# Patient Record
Sex: Female | Born: 1975 | Race: White | Hispanic: No | Marital: Married | Smoking: Never smoker
Health system: Southern US, Community
[De-identification: ages and names within clinical notes are randomized; demographics above are authoritative.]

## PROBLEM LIST (undated history)

## (undated) DIAGNOSIS — B009 Herpesviral infection, unspecified: Secondary | ICD-10-CM

## (undated) DIAGNOSIS — Z124 Encounter for screening for malignant neoplasm of cervix: Secondary | ICD-10-CM

## (undated) DIAGNOSIS — R000 Tachycardia, unspecified: Secondary | ICD-10-CM

## (undated) DIAGNOSIS — E559 Vitamin D deficiency, unspecified: Secondary | ICD-10-CM

## (undated) DIAGNOSIS — E162 Hypoglycemia, unspecified: Secondary | ICD-10-CM

## (undated) DIAGNOSIS — L309 Dermatitis, unspecified: Principal | ICD-10-CM

## (undated) DIAGNOSIS — N3010 Interstitial cystitis (chronic) without hematuria: Secondary | ICD-10-CM

## (undated) DIAGNOSIS — K219 Gastro-esophageal reflux disease without esophagitis: Secondary | ICD-10-CM

## (undated) DIAGNOSIS — R634 Abnormal weight loss: Secondary | ICD-10-CM

## (undated) DIAGNOSIS — M5020 Other cervical disc displacement, unspecified cervical region: Secondary | ICD-10-CM

## (undated) DIAGNOSIS — G4700 Insomnia, unspecified: Secondary | ICD-10-CM

## (undated) DIAGNOSIS — E538 Deficiency of other specified B group vitamins: Secondary | ICD-10-CM

## (undated) DIAGNOSIS — I7300 Raynaud's syndrome without gangrene: Secondary | ICD-10-CM

## (undated) DIAGNOSIS — B019 Varicella without complication: Secondary | ICD-10-CM

## (undated) DIAGNOSIS — Z9289 Personal history of other medical treatment: Secondary | ICD-10-CM

## (undated) HISTORY — DX: Interstitial cystitis (chronic) without hematuria: N30.10

## (undated) HISTORY — DX: Raynaud's syndrome without gangrene: I73.00

## (undated) HISTORY — DX: Vitamin D deficiency, unspecified: E55.9

## (undated) HISTORY — DX: Deficiency of other specified B group vitamins: E53.8

## (undated) HISTORY — DX: Dermatitis, unspecified: L30.9

## (undated) HISTORY — DX: Hypoglycemia, unspecified: E16.2

## (undated) HISTORY — DX: Herpesviral infection, unspecified: B00.9

## (undated) HISTORY — DX: Personal history of other medical treatment: Z92.89

## (undated) HISTORY — DX: Tachycardia, unspecified: R00.0

## (undated) HISTORY — DX: Varicella without complication: B01.9

## (undated) HISTORY — DX: Insomnia, unspecified: G47.00

## (undated) HISTORY — DX: Encounter for screening for malignant neoplasm of cervix: Z12.4

## (undated) HISTORY — DX: Abnormal weight loss: R63.4

---

## 1998-04-12 ENCOUNTER — Inpatient Hospital Stay (HOSPITAL_COMMUNITY): Admission: AD | Admit: 1998-04-12 | Discharge: 1998-04-12 | Payer: Self-pay | Admitting: Obstetrics and Gynecology

## 1998-07-02 ENCOUNTER — Ambulatory Visit (HOSPITAL_COMMUNITY): Admission: RE | Admit: 1998-07-02 | Discharge: 1998-07-02 | Payer: Self-pay | Admitting: Urology

## 1999-06-13 HISTORY — PX: OTHER SURGICAL HISTORY: SHX169

## 1999-12-13 ENCOUNTER — Other Ambulatory Visit: Admission: RE | Admit: 1999-12-13 | Discharge: 1999-12-13 | Payer: Self-pay | Admitting: Gynecology

## 2002-08-05 ENCOUNTER — Other Ambulatory Visit: Admission: RE | Admit: 2002-08-05 | Discharge: 2002-08-05 | Payer: Self-pay | Admitting: Gynecology

## 2003-08-11 ENCOUNTER — Other Ambulatory Visit: Admission: RE | Admit: 2003-08-11 | Discharge: 2003-08-11 | Payer: Self-pay | Admitting: Gynecology

## 2004-10-19 ENCOUNTER — Other Ambulatory Visit: Admission: RE | Admit: 2004-10-19 | Discharge: 2004-10-19 | Payer: Self-pay | Admitting: Gynecology

## 2004-11-03 ENCOUNTER — Other Ambulatory Visit: Admission: RE | Admit: 2004-11-03 | Discharge: 2004-11-03 | Payer: Self-pay | Admitting: Gynecology

## 2005-03-20 ENCOUNTER — Other Ambulatory Visit: Admission: RE | Admit: 2005-03-20 | Discharge: 2005-03-20 | Payer: Self-pay | Admitting: Gynecology

## 2005-05-12 ENCOUNTER — Ambulatory Visit (HOSPITAL_COMMUNITY): Admission: RE | Admit: 2005-05-12 | Discharge: 2005-05-12 | Payer: Self-pay | Admitting: Obstetrics and Gynecology

## 2005-05-12 ENCOUNTER — Encounter (INDEPENDENT_AMBULATORY_CARE_PROVIDER_SITE_OTHER): Payer: Self-pay | Admitting: Specialist

## 2005-08-30 ENCOUNTER — Other Ambulatory Visit: Admission: RE | Admit: 2005-08-30 | Discharge: 2005-08-30 | Payer: Self-pay | Admitting: Gynecology

## 2006-03-19 ENCOUNTER — Other Ambulatory Visit: Admission: RE | Admit: 2006-03-19 | Discharge: 2006-03-19 | Payer: Self-pay | Admitting: Gynecology

## 2007-02-14 ENCOUNTER — Other Ambulatory Visit: Admission: RE | Admit: 2007-02-14 | Discharge: 2007-02-14 | Payer: Self-pay | Admitting: Gynecology

## 2010-10-28 NOTE — Op Note (Signed)
Wendy Harrell, Wendy Harrell NO.:  [id number]   MEDICAL RECORD NO.:  [id number]          PATIENT TYPE:  AMB   LOCATION:  SDC                           FACILITY:  [facility]   PHYSICIAN:  Randye Lobo, M.D.   DATE OF BIRTH:  17-Jan-1976   DATE OF PROCEDURE:  05/12/2005  DATE OF DISCHARGE:                                 OPERATIVE REPORT   PREOPERATIVE DIAGNOSES:  1.  Intrauterine gestation at 11 +1 weeks.  2.  Missed abortion  3.  Rh negative status.   POSTOPERATIVE DIAGNOSES:  1.  Intrauterine gestation at 11 weeks.  2.  Missed abortion.  3.  Rh negative status.   PROCEDURE:  Dilation and evacuation.   SURGEON:  [name], M.D.   ANESTHESIA:  MAC, paracervical block with 1% lidocaine.   IV FLUIDS:  400 mL Ringer's lactate.   ESTIMATED BLOOD LOSS:  Minimal.   URINE OUTPUT:  425 mL.   COMPLICATIONS:  None.   INDICATIONS FOR PROCEDURE:  The patient is a 35 year old gravida 1, para 63,  Caucasian female with a last menstrual period February 24, 2005, who  presented to the office on May 11, 2005, reporting vaginal spotting.  The patient had previously been seen through a primary care office to  confirm her pregnancy at an early gestational age and was sent for  initiation of obstetric care.  The patient was seen approximately 10 days  ago, at which time she was noted to have a uterine size which was consistent  with 6 weeks.  The patient had an ultrasound performed when she presented on  May 11, 2005.  There was evidence of a 6+ weeks' gestation with a fetal  pole and no cardiac activity.  There was a small subchorionic hemorrhage and  the gestational sac was starting to have an irregular shape to it.  The  patient was diagnosed with a missed abortion.  A plan was made to proceed  with a dilation and evacuation after risks, benefits, and alternatives were  discussed with her.   The patient was noted to be Rh negative.   FINDINGS:  Examination under  anesthesia revealed a slightly retroverted  uterus, which was noted to be approximately 7-8 weeks' size.  No adnexal  masses were appreciated.   A moderate amount of products of conception were obtained.   SPECIMENS:  Products of conception were sent to pathology.   PROCEDURE:  The patient was identified in the preoperative hold area.  She  did receive Ancef 1 g intravenously for antibiotic prophylaxis.   In the operating room, MAC anesthesia was induced.  The patient's perineum  and vagina were then sterilely prepped and draped and her bladder was  catheterized of urine.   A speculum was placed inside the vagina and a single-tooth tenaculum was  placed on the anterior cervical lip.  The paracervical block was performed  in a standard fashion with a total of 10 mL of 1% lidocaine.  The uterus was  then sounded to 9 cm.  The cervix was then sequentially dilated up to a #25  Pratt dilator.  A #  8 suction-tip curette was then inserted through the  cervical os to the level of the uterine fundus.  It was withdrawn slightly.  Appropriate pressure was applied and the suction tip was withdrawn from the  uterine cavity while turning it in a clockwise fashion.  This was repeated  two additional times.  Sharp curettage was then gently performed in all four  quadrants of the uterus and the endometrium had a gritty texture to it.  The  suction-tip curette was introduced one final time into the uterine cavity to  remove any remaining blood clots.   This concluded the patient's procedure.  There were no complications.  All  needle, instrument, and sponge counts were correct.  The patient is escorted  to the recovery room in stable and awake condition.      Randye Lobo, M.D.  Electronically Signed     BES/MEDQ  D:  05/12/2005  T:  05/12/2005  Job:  [id number]

## 2011-02-24 ENCOUNTER — Encounter: Payer: Self-pay | Admitting: Family Medicine

## 2011-02-24 ENCOUNTER — Ambulatory Visit (INDEPENDENT_AMBULATORY_CARE_PROVIDER_SITE_OTHER): Payer: BC Managed Care – PPO | Admitting: Family Medicine

## 2011-02-24 VITALS — BP 126/81 | HR 79 | Temp 98.7°F | Ht 62.25 in | Wt 130.8 lb

## 2011-02-24 DIAGNOSIS — N3010 Interstitial cystitis (chronic) without hematuria: Secondary | ICD-10-CM

## 2011-02-24 DIAGNOSIS — Z0000 Encounter for general adult medical examination without abnormal findings: Secondary | ICD-10-CM

## 2011-02-24 DIAGNOSIS — L259 Unspecified contact dermatitis, unspecified cause: Secondary | ICD-10-CM

## 2011-02-24 DIAGNOSIS — L309 Dermatitis, unspecified: Secondary | ICD-10-CM

## 2011-02-24 DIAGNOSIS — B009 Herpesviral infection, unspecified: Secondary | ICD-10-CM

## 2011-02-24 MED ORDER — VALACYCLOVIR HCL 500 MG PO TABS: 500.0000 mg | ORAL_TABLET | Freq: Three times a day (TID) | ORAL | Status: AC

## 2011-02-24 MED ORDER — TRIAMCINOLONE ACETONIDE 0.1 % EX CREA: TOPICAL_CREAM | Freq: Two times a day (BID) | CUTANEOUS | Status: AC

## 2011-02-24 NOTE — Patient Instructions (Addendum)
Preventative Care for Adults - Female Studies show that half of deaths in the [place] today result from unhealthy lifestyle practices. This includes ignoring preventive care suggestions. Preventive health guidelines for women include the following key practices:  A routine yearly physical is a good way to check with your primary caregiver about your health and preventive screening. It is a chance to share any concerns and updates on your health, and to receive a thorough all-over exam.   If you smoke cigarettes, find out from your caregiver how to quit. It can literally save your life, no matter how long you have been a tobacco user. If you do not use tobacco, never start.   Maintain a healthy diet and normal weight. Increased weight leads to problems with blood pressure and diabetes. Decrease saturated fat in your diet and increase regular exercise. Eat a variety of foods, including fruit, vegetables, animal or vegetable protein (meat, fish, chicken, and eggs, or beans, lentils, and tofu), and grains, such as rice. Get information about proper diet from your caregiver, if needed.   Aerobic exercise helps maintain good heart health. The CDC and the American College of Sports Medicine recommend 30 minutes of moderate-intensity exercise (a brisk walk that increases your heart rate and breathing) on most days of the week. Ongoing high blood pressure should be treated with medicines, if weight loss and exercise are not effective.   Avoid smoking, drinking too much alcohol (more than two drinks per day), and use of street drugs. Do not share needles with anyone. Ask for professional help if you need support or instructions about stopping the use of alcohol, cigarettes, or drugs.   Maintain normal blood lipids and cholesterol, by minimizing your intake of saturated fat. Eat a well rounded diet, with plenty of fruit and vegetables. The National Institutes of Health encourage women to eat 5-9 servings of  fruit and vegetables each day. Your caregiver can give instructions to help you keep your risk of heart disease or stroke low. High blood pressure causes heart disease and increases risk of stroke. Blood pressure should be checked every 1-2 years, from age 20 onward.   Blood tests for high cholesterol, which causes heart and vessel disease, should begin at age 20 and be repeated every 5 years, if test results are normal. (Repeat tests more often if results are high.)   Diabetes screening involves taking a blood sample to check your blood sugar level, after a fasting period. This is done once every 3 years, after age 45, if test results are normal.   Breast cancer screening is essential to preventive care for women. All women age 20 and older should perform a breast self-exam every month. At age 40 and older, women should have their caregiver complete a breast exam each year. Women at ages 40-50 should have a mammogram (x-ray film) of the breasts each year. Your caregiver can discuss when to start your yearly mammograms.   Cervical cancer screening includes taking a Pap smear (sample of cells examined under a microscope) from the cervix (end of the uterus). It also includes testing for HPV (Human Papilloma Virus, which can cause cervical cancer). Screening and a pelvic exam should begin at age 21, or 3 years after a woman becomes sexually active. Screening should occur every year, with a Pap smear but no HPV testing, up to age 30. After age 30, you should have a Pap smear every 3 years with HPV testing, if no HPV was found previously.     Colon cancer can be detected, and often prevented, long before it is life threatening. Most routine colon cancer screening begins at the age of 50. On a yearly basis, your caregiver may provide easy-to-use take-home tests to check for hidden blood in the stool. Use of a small camera at the end of a tube, to directly examine the colon (sigmoidoscopy or colonoscopy), can  detect the earliest forms of colon cancer and can be life saving. Talk to your caregiver about this at age 50, when routine screening begins. (Screening is repeated every 5 years, unless early forms of pre-cancerous polyps or small growths are found.)   Practice safe sex. Use condoms. Condoms are used for birth control and to reduce the spread of sexually transmitted infections (STIs). Unsafe sex is sexual activity without the use of safeguards, such as condoms and avoidance of high-risk acts, to reduce the chances of getting or spreading STIs. STIs include gonorrhea (the clap), chlamydia, syphilis, trichimonas, herpes, HPV (human papilloma virus) and HIV (human immunodeficiency virus), which causes AIDS. Herpes, HIV, and HPV are viral illnesses that have no cure. They can result in disability, cancer, and death.   HPV causes cancer of the cervix, and other infections that can be transmitted from person to person. There is a vaccine for HPV, and females should get immunized between the ages of 11 and 26. It requires a series of 3 shots.   Osteoporosis is a disease in which the bones lose minerals and strength as we age. This can result in serious bone fractures. Risk of osteoporosis can be identified using a bone density scan. Women ages 65 and over should discuss this with their caregivers, as should women after menopause who have other risk factors. Ask your caregiver whether you should be taking a calcium supplement and Vitamin D, to reduce the rate of osteoporosis.   Menopause can be associated with physical symptoms and risks. Hormone replacement therapy is available to decrease these. You should talk to your caregiver about whether starting or continuing to take hormones is right for you.   Use sunscreen with SPF (skin protection factor) of 15 or more. Apply sunscreen liberally and repeatedly throughout the day. Being outside in the sun, when your shadow is shorter than you are, means you are being  exposed to sun at greater intensity. Lighter skinned people are at a greater risk of skin cancer. Wear sunglasses, to protect your eyes from too much damaging sunlight (which can speed up cataract formation).   Once a month, do a whole body skin exam or review, using a mirror to look at your back. Notify your caregiver of changes in moles, especially if there are changes in shapes, colors, irregular border, a size larger than a pencil eraser, or new moles develop.   Keep carbon monoxide and smoke detectors in your home, and functioning, at all times. Change the batteries every 6 months, or use a model that plugs into the wall.   Stay up to date with your tetanus shots and other required immunizations. A booster for tetanus should be given every 10 years. Be sure to get your flu shot every year, since 5%-20% of the [place] population comes down with the flu. The composition of the flu vaccine changes each year, so being vaccinated once is not enough. Get your shot in the fall, before the flu season peaks. The table below lists important vaccines to get. Other vaccines to consider include for Hepatitis A virus (to prevent a form of   infection of the liver, by a virus acquired from food), Varicella Zoster (a virus that causes shingles), and Meninogoccal (against bacteria which cause a form of meningitis).   Brush your teeth twice a day with fluoride toothpaste, and floss once a day. Good oral hygiene prevents tooth decay and gum disease, which can be painful and can cause other health problems. Visit your dentist for a routine oral and dental check up and preventive care every 6-12 months.   The Body Mass Index or BMI is a way of measuring how much of your body is fat. Having a BMI above 27 increases the risk of heart disease, diabetes, hypertension, stroke and other problems related to obesity. Your caregiver can help determine your BMI, and can develop an exercise and dietary program to help you achieve or  maintain this measurement at a healthy level.   Wear seat belts whenever you are in a vehicle, whether as passenger or driver, and even for short drives of a few minutes.   If you bicycle, wear a helmet at all times.  Preventative Care for Adult Women  Preventative Services Ages 4-39 Ages 8-64 Ages 49 and over  Health risk assessment and lifestyle counseling.     Blood pressure check.** Every 1-2 years Every 1-2 years Every 1-2 years  Total cholesterol check including HDL.** Every 5 years beginning at age 61 Every 5 years beginning at age 42, or more often if risk is high Every 5 years through age 58, then optional  Breast self exam. Monthly in all women ages 91 and older Monthly Monthly  Clinical breast exam.** Every 3 years beginning at age 82 Every year Every year  Mammogram.**  Every year beginning at age 83, optional from age 20-49 (discuss with your caregiver). Every year until age 89, then optional  Pap Smear** and HPV Screening. Every year from ages 68 through 61 Every 3 years from ages 59 through 83, if HPV is negative Optional; talk with your caregiver  Flexible sigmoidoscopy** or colonoscopy.**   Every 5 years beginning at age 61 Every 5 years until age 38; then optional  FOBT (fecal occult blood test) of stool.  Every year beginning at age 17 Every year until 10; then optional  Skin self-exam. Monthly Monthly Monthly  Tetanus-diphtheria (Td) immunization. Every 10 years Every 10 years Every 10 years  Influenza immunization.** Every year Every year Every year  HPV immunization. Once between the ages of 12 and 61     Pneumococcal immunization.** Optional Optional Every 5 years  Hepatitis B immunization.** Series of 3 immunizations  (if not done previously, usually given at 0, 1 to 2, and 4 to 6 months)  Check with your caregiver, if vaccination not previously given Check with your caregiver, if vaccination not previously given  ** Family history and personal history of risk and  conditions may change your caregiver's recommendations.  Document Released: 07/25/2001 Document Re-Released: 08/23/2009 [facility] Patient Information 2011 [place], [place].   Start MegaRed caps 1 daily by Schiff  Take your probiotic every day

## 2011-03-01 ENCOUNTER — Encounter: Payer: Self-pay | Admitting: Family Medicine

## 2011-03-01 DIAGNOSIS — B009 Herpesviral infection, unspecified: Secondary | ICD-10-CM

## 2011-03-01 DIAGNOSIS — N3010 Interstitial cystitis (chronic) without hematuria: Secondary | ICD-10-CM | POA: Insufficient documentation

## 2011-03-01 DIAGNOSIS — Z0000 Encounter for general adult medical examination without abnormal findings: Secondary | ICD-10-CM | POA: Insufficient documentation

## 2011-03-01 DIAGNOSIS — L309 Dermatitis, unspecified: Secondary | ICD-10-CM | POA: Insufficient documentation

## 2011-03-01 HISTORY — DX: Herpesviral infection, unspecified: B00.9

## 2011-03-01 HISTORY — DX: Interstitial cystitis (chronic) without hematuria: N30.10

## 2011-03-01 HISTORY — DX: Dermatitis, unspecified: L30.9

## 2011-03-01 NOTE — Assessment & Plan Note (Signed)
Was recently started on Acyclovir 800mg  tid for an outbreak on her chest. 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

## 2011-03-01 NOTE — Assessment & Plan Note (Signed)
Patient previously took Amitriptyline when she had significant trouble in 2001, more recently she has had very little trouble and does not take anything. As it flares from time to time she is encouraged to increase hydration and avoid offending foods.

## 2011-03-01 NOTE — Assessment & Plan Note (Addendum)
Encouraged to get 7-8 hours of sleep nightly, exercise regularly eat small, frequent meals with lean proteins and complex carbs. Use seat belts regularly and start MegaRed fish oil caps daily. For her gassy symptoms she is encouraged to avoid offending foods consider adding a fiber supplement and a probiotic and report if symptoms worsen

## 2011-03-01 NOTE — Progress Notes (Signed)
Wendy Harrell [id number] 23-Jul-1975 03/01/2011      Progress Note New Patient  Subjective  Chief Complaint  Chief Complaint  Patient presents with  . Establish Care    new patient    HPI  Patient is a 35 year old Caucasian female who is in today for new patient appointment. 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 She describes them as clustered on the on the left side of her chest erythematous and there is a burning pain to them. She was started on acyclovir earlier in the week for that. Then over the last couple of issues also developed an urticarial raised very itchy rash in the left arm which she says is different. She was out at a race several days ago and the rash appeared and that she believes she got into some poison oak. She has not tried anything topically for this so far. Her other complaints are some mild gaseousness and cramping off and on over several weeks this is slowly improving. No fevers, chills, bloody stool, constipation, diarrhea. She reports some low-grade issues with depression and cholesterol when she was younger but this has not been persistent. She denies any other acute complaints at this time. Did have a history of interstitial cystitis but that has also improved. On occasion she gets some mild irritation and burning but no longer requires medications.  Past Medical History  Diagnosis Date  . Chicken pox as a child  . 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 Interstitial cystitis 03/01/2011  . Dermatitis 03/01/2011    Past Surgical History  Procedure Date  . Laproscopy 2001  . Cystoscopy 2001    benign    Family History  Problem Relation Age of Onset  . Depression Father   . Heart disease Father   . Hypertension Father   . Hyperlipidemia Father   . Diabetes Father     type 2  . Arthritis Father   . Anxiety disorder Father   . Alcohol abuse Maternal Grandfather   . Depression Paternal Grandmother   . Alcohol abuse Paternal Grandmother     . Alcohol abuse Paternal Grandfather   . Heart attack Paternal Grandfather   . Cancer Maternal Grandmother     skin cancer, breast cancer    History   Social History  . Marital Status: Married    Spouse Name: N/A    Number of Children: N/A  . Years of Education: N/A   Occupational History  . Not on file.   Social History Main Topics  . Smoking status: Never Smoker   . Smokeless tobacco: Never Used  . Alcohol Use: No  . Drug Use: No  . Sexually Active: Yes -- Female partner(s)   Other Topics Concern  . Not on file   Social History Narrative  . No narrative on file    No current outpatient prescriptions on file prior to visit.    Allergies  Allergen Reactions  . Sulfa Antibiotics Rash    Blisters in the mouth    Review of Systems  Review of Systems  Constitutional: Negative for fever, chills and malaise/fatigue.  HENT: Negative for hearing loss, nosebleeds and congestion.   Eyes: Negative for discharge.  Respiratory: Negative for cough, sputum production, shortness of breath and wheezing.   Cardiovascular: Negative for chest pain, palpitations and leg swelling.  Gastrointestinal: Positive for abdominal pain. Negative for heartburn, nausea, vomiting, diarrhea, constipation and blood in stool.  Genitourinary: Negative for dysuria, urgency, frequency and hematuria.  Musculoskeletal: Negative for myalgias, back pain and falls.  Skin: Positive for rash.       Left anterior chest wall, red and vesicular, left arm with hives, raised and red, itchy  Neurological: Negative for dizziness, tremors, sensory change, focal weakness, loss of consciousness, weakness and headaches.  Endo/Heme/Allergies: Negative for polydipsia. Does not bruise/bleed easily.  Psychiatric/Behavioral: Negative for depression and suicidal ideas. The patient is not nervous/anxious and does not have insomnia.     Objective  BP 126/81  Pulse 79  Temp(Src) 98.7 F (37.1 C) (Oral)  Ht 5' 2.25"  (1.581 m)  Wt 130 lb 12.8 oz (59.33 kg)  BMI 23.73 kg/m2  SpO2 99%  LMP 02/11/2011  Physical Exam  Physical Exam  Constitutional: She is oriented to person, place, and time and well-developed, well-nourished, and in no distress. No distress.  HENT:  Head: Normocephalic and atraumatic.  Right Ear: External ear normal.  Left Ear: External ear normal.  Nose: Nose normal.  Mouth/Throat: Oropharynx is clear and moist. No oropharyngeal exudate.  Eyes: Conjunctivae are normal. Pupils are equal, round, and reactive to light. Right eye exhibits no discharge. Left eye exhibits no discharge. No scleral icterus.  Neck: Normal range of motion. Neck supple. No thyromegaly present.  Cardiovascular: Normal rate, regular rhythm, normal heart sounds and intact distal pulses.   No murmur heard. Pulmonary/Chest: Effort normal and breath sounds normal. No respiratory distress. She has no wheezes. She has no rales.  Abdominal: Soft. Bowel sounds are normal. She exhibits no distension and no mass. There is no tenderness.  Musculoskeletal: Normal range of motion. She exhibits no edema and no tenderness.  Lymphadenopathy:    She has no cervical adenopathy.  Neurological: She is alert and oriented to person, place, and time. She has normal reflexes. No cranial nerve deficit. Coordination normal.  Skin: Skin is warm and dry. Rash noted. She is not diaphoretic. There is erythema.       Left arm, irregulary shaped oblong lesion centered around elbow, roughly 2 in by 5 in, raised and erythematous most notably at edge of lesion. On left anterior CW, small patch of vesicles on erythematous base  Psychiatric: Mood, memory and affect normal.       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  Dermatitis Lesion on left arm is urticarial and she did develop this after bing outside on Sunday. It is pruritic, Is asked to clean it well with mild soap and water, cut fingernails, wipe down with [facility] Astringent and the apply Triamcinolone Cream as prescribed.  Interstitial cystitis Patient previously took Amitriptyline when she had significant trouble in 2001, more recently she has had very little trouble and does not take anything. As it flares from time to time she is encouraged to increase hydration and avoid offending foods.   Preventative health care Encouraged to get 7-8 hours of sleep nightly, exercise regularly eat small, frequent meals with lean proteins and complex carbs. Use seat belts regularly and start MegaRed fish oil caps daily. For her gassy symptoms she is encouraged to avoid offending foods consider adding a fiber supplement and a probiotic and report if symptoms worsen

## 2011-03-01 NOTE — Assessment & Plan Note (Signed)
Lesion on left arm is urticarial and she did develop this after bing outside on Sunday. It is pruritic, Is asked to clean it well with mild soap and water, cut fingernails, wipe down with [facility] Astringent and the apply Triamcinolone Cream as prescribed.

## 2011-07-18 ENCOUNTER — Encounter: Payer: Self-pay | Admitting: Family Medicine

## 2011-07-18 ENCOUNTER — Ambulatory Visit (INDEPENDENT_AMBULATORY_CARE_PROVIDER_SITE_OTHER): Payer: BC Managed Care – PPO | Admitting: Family Medicine

## 2011-07-18 VITALS — BP 146/85 | HR 129 | Temp 100.5°F | Ht 62.25 in | Wt 128.8 lb

## 2011-07-18 DIAGNOSIS — J329 Chronic sinusitis, unspecified: Secondary | ICD-10-CM

## 2011-07-18 MED ORDER — GUAIFENESIN ER 600 MG PO TB12: 600.0000 mg | ORAL_TABLET | Freq: Two times a day (BID) | ORAL | Status: AC

## 2011-07-18 MED ORDER — HYDROCOD POLST-CHLORPHEN POLST 10-8 MG/5ML PO LQCR: 5.0000 mL | Freq: Two times a day (BID) | ORAL | Status: DC | PRN

## 2011-07-18 MED ORDER — CEFDINIR 300 MG PO CAPS: 300.0000 mg | ORAL_CAPSULE | Freq: Two times a day (BID) | ORAL | Status: AC

## 2011-07-18 NOTE — Assessment & Plan Note (Signed)
Patient sick with persistent fevers and congestion and now worsening facial pain. Will rx Cefdinir 300 mg po bid and Mucinex bid, increase rest and fluids, given Tussionex to use qhs for cough.

## 2011-07-18 NOTE — Progress Notes (Signed)
Patient ID: Wendy Harrell, female   DOB: 1975-10-11, 36 y.o.   MRN: [id number] Wendy Harrell [id number] 10/03/1975 07/18/2011      Progress Note-Follow Up  Subjective  Chief Complaint  Chief Complaint  Patient presents with  . Sinusitis    sinus infection X 10 days  . Cough    X 10 days w/ phlegm (white)  . Nausea    today    HPI  Patient is a 36 yo female in today with a 10 day history of persistent fevers, congestion, cough. She's been having fevers over 100 for days now. She is worsening facial pain or pressure over both cheeks. She has congestion now productive of yellow to clear sputum. His postnasal drip cough that keeps her up at night. The cough has just become productive. In the last 24 hours she's also noted some nausea and she has been struggling with diarrhea for days now. She has generalized malaise myalgias and fatigue as well. Past Medical History  Diagnosis Date  . Chicken pox as a child  . 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 Interstitial cystitis 03/01/2011  . Dermatitis 03/01/2011    Past Surgical History  Procedure Date  . Laproscopy 2001  . Cystoscopy 2001    benign    Family History  Problem Relation Age of Onset  . Depression Father   . Heart disease Father   . Hypertension Father   . Hyperlipidemia Father   . Diabetes Father     type 2  . Arthritis Father   . Anxiety disorder Father   . Alcohol abuse Maternal Grandfather   . Depression Paternal Grandmother   . Alcohol abuse Paternal Grandmother   . Alcohol abuse Paternal Grandfather   . Heart attack Paternal Grandfather   . Cancer Maternal Grandmother     skin cancer, breast cancer    History   Social History  . Marital Status: Married    Spouse Name: N/A    Number of Children: N/A  . Years of Education: N/A   Occupational History  . Not on file.   Social History Main Topics  . Smoking status: Never Smoker   . Smokeless tobacco: Never Used  . Alcohol Use: No  . Drug Use: No  .  Sexually Active: Yes -- Female partner(s)   Other Topics Concern  . Not on file   Social History Narrative  . No narrative on file    Current Outpatient Prescriptions on File Prior to Visit  Medication Sig Dispense Refill  . Multiple Vitamin (MULTIVITAMIN) tablet Take 1 tablet by mouth daily.        Marland Kitchen triamcinolone (KENALOG) 0.1 % cream Apply topically 2 (two) times daily.  45 g  0    Allergies  Allergen Reactions  . Sulfa Antibiotics Rash    Blisters in the mouth    Review of Systems  Review of Systems  Constitutional: Positive for fever and malaise/fatigue.  HENT: Positive for ear pain, congestion and sore throat.   Eyes: Negative for discharge.  Respiratory: Positive for cough. Negative for shortness of breath and wheezing.   Cardiovascular: Negative for chest pain, palpitations and leg swelling.  Gastrointestinal: Positive for nausea and diarrhea. Negative for heartburn and abdominal pain.  Genitourinary: Negative for dysuria.  Musculoskeletal: Positive for myalgias. Negative for falls.  Skin: Negative for rash.  Neurological: Positive for headaches. Negative for loss of consciousness.  Endo/Heme/Allergies: Negative for polydipsia.  Psychiatric/Behavioral: Negative for depression and suicidal ideas. The patient is not  nervous/anxious and does not have insomnia.     Objective  BP 146/85  Pulse 129  Temp(Src) 100.5 F (38.1 C) (Temporal)  Ht 5' 2.25" (1.581 m)  Wt 128 lb 12.8 oz (58.423 kg)  BMI 23.37 kg/m2  SpO2 100%  LMP 07/17/2011  Physical Exam  Physical Exam  Constitutional: She is oriented to person, place, and time and well-developed, well-nourished, and in no distress. No distress.  HENT:  Head: Normocephalic and atraumatic.       B/l ear canals with a large amount of wax in b/l canalsm able to see past b/l at top, tm's dull but not erythematous. Nasal mucosa boggy and erythematous  Eyes: Conjunctivae are normal.  Neck: Neck supple. No thyromegaly  present.  Cardiovascular: Normal rate, regular rhythm and normal heart sounds.   No murmur heard. Pulmonary/Chest: Effort normal and breath sounds normal. She has no wheezes.  Abdominal: She exhibits no distension and no mass.  Musculoskeletal: She exhibits no edema.  Lymphadenopathy:    She has no cervical adenopathy.  Neurological: She is alert and oriented to person, place, and time.  Skin: Skin is warm and dry. No rash noted. She is not diaphoretic.  Psychiatric: Memory, affect and judgment normal.       Assessment & Plan  Sinusitis Patient sick with persistent fevers and congestion and now worsening facial pain. Will rx Cefdinir 300 mg po bid and Mucinex bid, increase rest and fluids, given Tussionex to use qhs for cough.

## 2011-07-18 NOTE — Patient Instructions (Signed)
Sinusitis Sinuses are air pockets within the bones of your face. The growth of bacteria within a sinus leads to infection. The infection prevents the sinuses from draining. This infection is called sinusitis. SYMPTOMS  There will be different areas of pain depending on which sinuses have become infected.  The maxillary sinuses often produce pain beneath the eyes.   Frontal sinusitis may cause pain in the middle of the forehead and above the eyes.  Other problems (symptoms) include:  Toothaches.   Colored, pus-like (purulent) drainage from the nose.   Swelling, warmth, and tenderness over the sinus areas may be signs of infection.  TREATMENT  Sinusitis is most often determined by an exam.X-rays may be taken. If x-rays have been taken, make sure you obtain your results or find out how you are to obtain them. Your caregiver may give you medications (antibiotics). These are medications that will help kill the bacteria causing the infection. You may also be given a medication (decongestant) that helps to reduce sinus swelling.  HOME CARE INSTRUCTIONS   Only take over-the-counter or prescription medicines for pain, discomfort, or fever as directed by your caregiver.   Drink extra fluids. Fluids help thin the mucus so your sinuses can drain more easily.   Applying either moist heat or ice packs to the sinus areas may help relieve discomfort.   Use saline nasal sprays to help moisten your sinuses. The sprays can be found at your local drugstore.  SEEK IMMEDIATE MEDICAL CARE IF:  You have a fever.   You have increasing pain, severe headaches, or toothache.   You have nausea, vomiting, or drowsiness.   You develop unusual swelling around the face or trouble seeing.  MAKE SURE YOU:   Understand these instructions.   Will watch your condition.   Will get help right away if you are not doing well or get worse.  Document Released: 05/29/2005 Document Revised: 02/08/2011 Document Reviewed:  12/26/2006 [facility] Patient Information 2012 [place], [place].   Afrin at bed Nasal saline in am For ears, flush with warm water and hydrogen peroxide and call for further flushing as needed

## 2011-07-27 ENCOUNTER — Ambulatory Visit: Payer: BC Managed Care – PPO | Admitting: Family Medicine

## 2011-07-28 ENCOUNTER — Telehealth: Payer: Self-pay | Admitting: Emergency Medicine

## 2011-07-28 MED ORDER — FLUCONAZOLE 150 MG PO TABS: 150.0000 mg | ORAL_TABLET | Freq: Once | ORAL | Status: DC

## 2011-07-28 NOTE — Telephone Encounter (Signed)
Ok.  Noted--PM

## 2011-07-28 NOTE — Telephone Encounter (Signed)
Pt received Omnicef and now has yeast infection.  Would like RX for diflucan.  Per Dr. Milinda Cave OK.  Diflucan 150 mg, #1 x 0.  RX sent.

## 2012-08-05 ENCOUNTER — Other Ambulatory Visit: Payer: Self-pay | Admitting: Gynecology

## 2012-11-01 ENCOUNTER — Ambulatory Visit (INDEPENDENT_AMBULATORY_CARE_PROVIDER_SITE_OTHER): Payer: BC Managed Care – PPO | Admitting: Nurse Practitioner

## 2012-11-01 ENCOUNTER — Encounter: Payer: Self-pay | Admitting: Nurse Practitioner

## 2012-11-01 VITALS — BP 100/60 | HR 93 | Temp 98.2°F | Ht 62.25 in | Wt 133.8 lb

## 2012-11-01 DIAGNOSIS — B009 Herpesviral infection, unspecified: Secondary | ICD-10-CM | POA: Insufficient documentation

## 2012-11-01 NOTE — Patient Instructions (Addendum)
Your rash looks like HSV. Continue with valacyclovir 1 day dosing : 2 gm breakfast & bedtime for 1 day then discontinue. You may also use lysine 500 mg twice daily until rash begins to clear. Use benadryl cream for itching. Keep rash clean w/mild soapy wash twice dail, apply thin layer of bacitracin to leg rash to prevent secondary infection. Let us know if you need a new script. Feel better!

## 2012-11-01 NOTE — Progress Notes (Signed)
  Subjective:    Patient ID: Wendy Harrell, female    DOB: [birth year], 37 y.o.   MRN: [id number]  HPI    Review of Systems     Objective:   Physical Exam        Assessment & Plan:   Subjective:     Wendy Harrell is a 37 y.o. female who presents for evaluation of a rash involving the L groin, L arm L abdomen. Rash started 2 weeks ago, but continues to get new lesions. . ago. Lesions are pink, and raised, clusters of tiny vesicles approx 1mm in texture. Rash has changed over time. Rash is painful and is pruritic. Associated symptoms: none. Patient denies: arthralgia, fever and headache. Patient has not had contacts with similar rash. Patient has not had new exposures (soaps, lotions, laundry detergents, foods, medications, plants, insects or animals).  The following portions of the patient's history were reviewed and updated as appropriate: allergies, current medications, past family history, past medical history, past social history, past surgical history and problem list.  Review of Systems Constitutional: negative Integument/breast: negative, recurrent rash of HSV, previously on chest 9 mos ago. Now has new locations-see HPI Allergic/Immunologic: negative    Objective:    BP 100/60  Pulse 93  Temp(Src) 98.2 F (36.8 C) (Oral)  Ht 5' 2.25" (1.581 m)  Wt 133 lb 12 oz (60.669 kg)  BMI 24.27 kg/m2  SpO2 97%  LMP 10/18/2012 General:  alert, cooperative, appears stated age and no distress  Skin:  vesicles noted on L arm-few small clusters of vesicles, L abdomen linear cluster vesicles, L groin large cluster vesilces approx 3 cm discrete irreg shape.     Assessment:    HSV recurrence    Plan:    Valacyclovir 2000 mg q12h times 1 day. Pt has script. will call if needed. See pt instructions.

## 2013-06-03 ENCOUNTER — Ambulatory Visit (INDEPENDENT_AMBULATORY_CARE_PROVIDER_SITE_OTHER): Payer: BC Managed Care – PPO | Admitting: Nurse Practitioner

## 2013-06-03 ENCOUNTER — Ambulatory Visit: Payer: BC Managed Care – PPO | Admitting: Nurse Practitioner

## 2013-06-03 ENCOUNTER — Encounter: Payer: Self-pay | Admitting: Nurse Practitioner

## 2013-06-03 ENCOUNTER — Telehealth: Payer: Self-pay | Admitting: Nurse Practitioner

## 2013-06-03 VITALS — BP 100/60 | HR 119 | Ht 62.25 in | Wt 132.8 lb

## 2013-06-03 DIAGNOSIS — S39012A Strain of muscle, fascia and tendon of lower back, initial encounter: Secondary | ICD-10-CM

## 2013-06-03 DIAGNOSIS — IMO0002 Reserved for concepts with insufficient information to code with codable children: Secondary | ICD-10-CM

## 2013-06-03 MED ORDER — METHOCARBAMOL 500 MG PO TABS: 500.0000 mg | ORAL_TABLET | Freq: Four times a day (QID) | ORAL | Status: DC | PRN

## 2013-06-03 MED ORDER — PREDNISONE 10 MG PO TABS: ORAL_TABLET | ORAL | Status: DC

## 2013-06-03 MED ORDER — METHYLPREDNISOLONE ACETATE 40 MG/ML IJ SUSP
40.0000 mg | Freq: Once | INTRAMUSCULAR | Status: AC
  Administered 2013-06-03: 40 mg via INTRAMUSCULAR

## 2013-06-03 NOTE — Patient Instructions (Signed)
Take prednisone early in day so it does not keep you awake. Stop if you feel depressed or have suicidal ideation. Use muscle relaxer when you cal hang out on couch or gog to bed. Walk every day. Do stretches daily. Hold positions 10-15 sec. Do 10 reps, repeat twice. Let us know if you do not feel better.  Lumbosacral Strain Lumbosacral strain is one of the most common causes of back pain. There are many causes of back pain. Most are not serious conditions. CAUSES  Your backbone (spinal column) is made up of 24 main vertebral bodies, the sacrum, and the coccyx. These are held together by muscles and tough, fibrous tissue (ligaments). Nerve roots pass through the openings between the vertebrae. A sudden move or injury to the back may cause injury to, or pressure on, these nerves. This may result in localized back pain or pain movement (radiation) into the buttocks, down the leg, and into the foot. Sharp, shooting pain from the buttock down the back of the leg (sciatica) is frequently associated with a ruptured (herniated) disk. Pain may be caused by muscle spasm alone. Your caregiver can often find the cause of your pain by the details of your symptoms and an exam. In some cases, you may need tests (such as X-rays). Your caregiver will work with you to decide if any tests are needed based on your specific exam. HOME CARE INSTRUCTIONS   Avoid an underactive lifestyle. Active exercise, as directed by your caregiver, is your greatest weapon against back pain.  Avoid hard physical activities (tennis, racquetball, waterskiing) if you are not in proper physical condition for it. This may aggravate or create problems.  If you have a back problem, avoid sports requiring sudden body movements. Swimming and walking are generally safer activities.  Maintain good posture.  Avoid becoming overweight (obese).  Use bed rest for only the most extreme, sudden (acute) episode. Your caregiver will help you determine  how much bed rest is necessary.  For acute conditions, you may put ice on the injured area.  Put ice in a plastic bag.  Place a towel between your skin and the bag.  Leave the ice on for 15-20 minutes at a time, every 2 hours, or as needed.  After you are improved and more active, it may help to apply heat for 30 minutes before activities. See your caregiver if you are having pain that lasts longer than expected. Your caregiver can advise appropriate exercises or therapy if needed. With conditioning, most back problems can be avoided. SEEK IMMEDIATE MEDICAL CARE IF:   You have numbness, tingling, weakness, or problems with the use of your arms or legs.  You experience severe back pain not relieved with medicines.  There is a change in bowel or bladder control.  You have increasing pain in any area of the body, including your belly (abdomen).  You notice shortness of breath, dizziness, or feel faint.  You feel sick to your stomach (nauseous), are throwing up (vomiting), or become sweaty.  You notice discoloration of your toes or legs, or your feet get very cold.  Your back pain is getting worse.  You have a fever. MAKE SURE YOU:   Understand these instructions.  Will watch your condition.  Will get help right away if you are not doing well or get worse. Document Released: 03/08/2005 Document Revised: 08/21/2011 Document Reviewed: 08/28/2008 [facility] Patient Information 2014 [place], [place].  Low Back Strain with Rehab A strain is an injury in  which a tendon or muscle is torn. The muscles and tendons of the lower back are vulnerable to strains. However, these muscles and tendons are very strong and require a great force to be injured. Strains are classified into three categories. Grade 1 strains cause pain, but the tendon is not lengthened. Grade 2 strains include a lengthened ligament, due to the ligament being stretched or partially ruptured. With grade 2 strains there is  still function, although the function may be decreased. Grade 3 strains involve a complete tear of the tendon or muscle, and function is usually impaired. SYMPTOMS   Pain in the lower back.  Pain that affects one side more than the other.  Pain that gets worse with movement and may be felt in the hip, buttocks, or back of the thigh.  Muscle spasms of the muscles in the back.  Swelling along the muscles of the back.  Loss of strength of the back muscles.  Crackling sound (crepitation) when the muscles are touched. CAUSES  Lower back strains occur when a force is placed on the muscles or tendons that is greater than they can handle. Common causes of injury include:  Prolonged overuse of the muscle-tendon units in the lower back, usually from incorrect posture.  A single violent injury or force applied to the back. RISK INCREASES WITH:  Sports that involve twisting forces on the spine or a lot of bending at the waist (football, rugby, weightlifting, bowling, golf, tennis, speed skating, racquetball, swimming, running, gymnastics, diving).  Poor strength and flexibility.  Failure to warm up properly before activity.  Family history of lower back pain or disk disorders.  Previous back injury or surgery (especially fusion).  Poor posture with lifting, especially heavy objects.  Prolonged sitting, especially with poor posture. PREVENTION   Learn and use proper posture when sitting or lifting (maintain proper posture when sitting, lift using the knees and legs, not at the waist).  Warm up and stretch properly before activity.  Allow for adequate recovery between workouts.  Maintain physical fitness:  Strength, flexibility, and endurance.  Cardiovascular fitness. PROGNOSIS  If treated properly, lower back strains usually heal within 6 weeks. RELATED COMPLICATIONS   Recurring symptoms, resulting in a chronic problem.  Chronic inflammation, scarring, and partial  muscle-tendon tear.  Delayed healing or resolution of symptoms.  Prolonged disability. TREATMENT  Treatment first involves the use of ice and medicine, to reduce pain and inflammation. The use of strengthening and stretching exercises may help reduce pain with activity. These exercises may be performed at home or with a therapist. Severe injuries may require referral to a therapist for further evaluation and treatment, such as ultrasound. Your caregiver may advise that you wear a back brace or corset, to help reduce pain and discomfort. Often, prolonged bed rest results in greater harm then benefit. Corticosteroid injections may be recommended. However, these should be reserved for the most serious cases. It is important to avoid using your back when lifting objects. At night, sleep on your back on a firm mattress with a pillow placed under your knees. If non-surgical treatment is unsuccessful, surgery may be needed.  MEDICATION   If pain medicine is needed, nonsteroidal anti-inflammatory medicines (aspirin and ibuprofen), or other minor pain relievers (acetaminophen), are often advised.  Do not take pain medicine for 7 days before surgery.  Prescription pain relievers may be given, if your caregiver thinks they are needed. Use only as directed and only as much as you need.  Ointments  applied to the skin may be helpful.  Corticosteroid injections may be given by your caregiver. These injections should be reserved for the most serious cases, because they may only be given a certain number of times. HEAT AND COLD  Cold treatment (icing) should be applied for 10 to 15 minutes every 2 to 3 hours for inflammation and pain, and immediately after activity that aggravates your symptoms. Use ice packs or an ice massage.  Heat treatment may be used before performing stretching and strengthening activities prescribed by your caregiver, physical therapist, or athletic trainer. Use a heat pack or a warm  water soak. SEEK MEDICAL CARE IF:   Symptoms get worse or do not improve in 2 to 4 weeks, despite treatment.  You develop numbness, weakness, or loss of bowel or bladder function.  New, unexplained symptoms develop. (Drugs used in treatment may produce side effects.) EXERCISES  RANGE OF MOTION (ROM) AND STRETCHING EXERCISES - Low Back Strain Most people with lower back pain will find that their symptoms get worse with excessive bending forward (flexion) or arching at the lower back (extension). The exercises which will help resolve your symptoms will focus on the opposite motion.  Your physician, physical therapist or athletic trainer will help you determine which exercises will be most helpful to resolve your lower back pain. Do not complete any exercises without first consulting with your caregiver. Discontinue any exercises which make your symptoms worse until you speak to your caregiver.  If you have pain, numbness or tingling which travels down into your buttocks, leg or foot, the goal of the therapy is for these symptoms to move closer to your back and eventually resolve. Sometimes, these leg symptoms will get better, but your lower back pain may worsen. This is typically an indication of progress in your rehabilitation. Be very alert to any changes in your symptoms and the activities in which you participated in the 24 hours prior to the change. Sharing this information with your caregiver will allow him/her to most efficiently treat your condition.  These exercises may help you when beginning to rehabilitate your injury. Your symptoms may resolve with or without further involvement from your physician, physical therapist or athletic trainer. While completing these exercises, remember:  Restoring tissue flexibility helps normal motion to return to the joints. This allows healthier, less painful movement and activity.  An effective stretch should be held for at least 30 seconds.  A stretch  should never be painful. You should only feel a gentle lengthening or release in the stretched tissue. FLEXION RANGE OF MOTION AND STRETCHING EXERCISES: STRETCH  Flexion, Single Knee to Chest   Lie on a firm bed or floor with both legs extended in front of you.  Keeping one leg in contact with the floor, bring your opposite knee to your chest. Hold your leg in place by either grabbing behind your thigh or at your knee.  Pull until you feel a gentle stretch in your lower back. Hold __________ seconds.  Slowly release your grasp and repeat the exercise with the opposite side. Repeat __________ times. Complete this exercise __________ times per day.   STRETCH  Low Trunk Rotation  Lie on a firm bed or floor. Keeping your legs in front of you, bend your knees so they are both pointed toward the ceiling and your feet are flat on the floor.  Extend your arms out to the side. This will stabilize your upper body by keeping your shoulders in contact with  the floor.  Gently and slowly drop both knees together to one side until you feel a gentle stretch in your lower back. Hold for __________ seconds.  Tense your stomach muscles to support your lower back as you bring your knees back to the starting position. Repeat the exercise to the other side. Repeat __________ times. Complete this exercise __________ times per day   RANGE OF MOTION- Quadruped, Neutral Spine   Assume a hands and knees position on a firm surface. Keep your hands under your shoulders and your knees under your hips. You may place padding under your knees for comfort.  Drop your head and point your tail bone toward the ground below you. This will round out your lower back like an angry cat. Hold this position for __________ seconds.  Slowly lift your head and release your tail bone so that your back sags into a large arch, like an old horse.  Hold this position for __________ seconds.  Repeat this until you feel limber in your  lower back.  Now, find your "sweet spot." This will be the most comfortable position somewhere between the two previous positions. This is your neutral spine. Once you have found this position, tense your stomach muscles to support your lower back.  Hold this position for __________ seconds. Repeat __________ times. Complete this exercise __________ times per day.  STRENGTHENING EXERCISES - Low Back Strain These exercises may help you when beginning to rehabilitate your injury. These exercises should be done near your "sweet spot." This is the neutral, low-back arch, somewhere between fully rounded and fully arched, that is your least painful position. When performed in this safe range of motion, these exercises can be used for people who have either a flexion or extension based injury. These exercises may resolve your symptoms with or without further involvement from your physician, physical therapist or athletic trainer. While completing these exercises, remember:   Muscles can gain both the endurance and the strength needed for everyday activities through controlled exercises.  Complete these exercises as instructed by your physician, physical therapist or athletic trainer. Increase the resistance and repetitions only as guided.  You may experience muscle soreness or fatigue, but the pain or discomfort you are trying to eliminate should never worsen during these exercises. If this pain does worsen, stop and make certain you are following the directions exactly. If the pain is still present after adjustments, discontinue the exercise until you can discuss the trouble with your caregiver. STRENGTHENING Deep Abdominals, Pelvic Tilt  Lie on a firm bed or floor. Keeping your legs in front of you, bend your knees so they are both pointed toward the ceiling and your feet are flat on the floor.  Tense your lower abdominal muscles to press your lower back into the floor. This motion will rotate your  pelvis so that your tail bone is scooping upwards rather than pointing at your feet or into the floor.  With a gentle tension and even breathing, hold this position for __________ seconds. Repeat __________ times. Complete this exercise __________ times per day.  STRENGTHENING  Abdominals, Crunches   Lie on a firm bed or floor. Keeping your legs in front of you, bend your knees so they are both pointed toward the ceiling and your feet are flat on the floor. Cross your arms over your chest.  Slightly tip your chin down without bending your neck.  Tense your abdominals and slowly lift your trunk high enough to just clear your  shoulder blades. Lifting higher can put excessive stress on the lower back and does not further strengthen your abdominal muscles.  Control your return to the starting position. Repeat __________ times. Complete this exercise __________ times per day.  STRENGTHENING  Quadruped, Opposite UE/LE Lift   Assume a hands and knees position on a firm surface. Keep your hands under your shoulders and your knees under your hips. You may place padding under your knees for comfort.  Find your neutral spine and gently tense your abdominal muscles so that you can maintain this position. Your shoulders and hips should form a rectangle that is parallel with the floor and is not twisted.  Keeping your trunk steady, lift your right hand no higher than your shoulder and then your left leg no higher than your hip. Make sure you are not holding your breath. Hold this position __________ seconds.  Continuing to keep your abdominal muscles tense and your back steady, slowly return to your starting position. Repeat with the opposite arm and leg. Repeat __________ times. Complete this exercise __________ times per day.  STRENGTHENING  Lower Abdominals, Double Knee Lift  Lie on a firm bed or floor. Keeping your legs in front of you, bend your knees so they are both pointed toward the ceiling and  your feet are flat on the floor.  Tense your abdominal muscles to brace your lower back and slowly lift both of your knees until they come over your hips. Be certain not to hold your breath.  Hold __________ seconds. Using your abdominal muscles, return to the starting position in a slow and controlled manner. Repeat __________ times. Complete this exercise __________ times per day.  POSTURE AND BODY MECHANICS CONSIDERATIONS - Low Back Strain Keeping correct posture when sitting, standing or completing your activities will reduce the stress put on different body tissues, allowing injured tissues a chance to heal and limiting painful experiences. The following are general guidelines for improved posture. Your physician or physical therapist will provide you with any instructions specific to your needs. While reading these guidelines, remember:  The exercises prescribed by your provider will help you have the flexibility and strength to maintain correct postures.  The correct posture provides the best environment for your joints to work. All of your joints have less wear and tear when properly supported by a spine with good posture. This means you will experience a healthier, less painful body.  Correct posture must be practiced with all of your activities, especially prolonged sitting and standing. Correct posture is as important when doing repetitive low-stress activities (typing) as it is when doing a single heavy-load activity (lifting). RESTING POSITIONS Consider which positions are most painful for you when choosing a resting position. If you have pain with flexion-based activities (sitting, bending, stooping, squatting), choose a position that allows you to rest in a less flexed posture. You would want to avoid curling into a fetal position on your side. If your pain worsens with extension-based activities (prolonged standing, working overhead), avoid resting in an extended position such as  sleeping on your stomach. Most people will find more comfort when they rest with their spine in a more neutral position, neither too rounded nor too arched. Lying on a non-sagging bed on your side with a pillow between your knees, or on your back with a pillow under your knees will often provide some relief. Keep in mind, being in any one position for a prolonged period of time, no matter how correct your  posture, can still lead to stiffness. PROPER SITTING POSTURE In order to minimize stress and discomfort on your spine, you must sit with correct posture. Sitting with good posture should be effortless for a healthy body. Returning to good posture is a gradual process. Many people can work toward this most comfortably by using various supports until they have the flexibility and strength to maintain this posture on their own. When sitting with proper posture, your ears will fall over your shoulders and your shoulders will fall over your hips. You should use the back of the chair to support your upper back. Your lower back will be in a neutral position, just slightly arched. You may place a small pillow or folded towel at the base of your lower back for support.  When working at a desk, create an environment that supports good, upright posture. Without extra support, muscles tire, which leads to excessive strain on joints and other tissues. Keep these recommendations in mind: CHAIR:  A chair should be able to slide under your desk when your back makes contact with the back of the chair. This allows you to work closely.  The chair's height should allow your eyes to be level with the upper part of your monitor and your hands to be slightly lower than your elbows. BODY POSITION  Your feet should make contact with the floor. If this is not possible, use a foot rest.  Keep your ears over your shoulders. This will reduce stress on your neck and lower back. INCORRECT SITTING POSTURES  If you are feeling  tired and unable to assume a healthy sitting posture, do not slouch or slump. This puts excessive strain on your back tissues, causing more damage and pain. Healthier options include:  Using more support, like a lumbar pillow.  Switching tasks to something that requires you to be upright or walking.  Talking a brief walk.  Lying down to rest in a neutral-spine position. PROLONGED STANDING WHILE SLIGHTLY LEANING FORWARD  When completing a task that requires you to lean forward while standing in one place for a long time, place either foot up on a stationary 2-4 inch high object to help maintain the best posture. When both feet are on the ground, the lower back tends to lose its slight inward curve. If this curve flattens (or becomes too large), then the back and your other joints will experience too much stress, tire more quickly, and can cause pain. CORRECT STANDING POSTURES Proper standing posture should be assumed with all daily activities, even if they only take a few moments, like when brushing your teeth. As in sitting, your ears should fall over your shoulders and your shoulders should fall over your hips. You should keep a slight tension in your abdominal muscles to brace your spine. Your tailbone should point down to the ground, not behind your body, resulting in an over-extended swayback posture.  INCORRECT STANDING POSTURES  Common incorrect standing postures include a forward head, locked knees and/or an excessive swayback. WALKING Walk with an upright posture. Your ears, shoulders and hips should all line-up. PROLONGED ACTIVITY IN A FLEXED POSITION When completing a task that requires you to bend forward at your waist or lean over a low surface, try to find a way to stabilize 3 out of 4 of your limbs. You can place a hand or elbow on your thigh or rest a knee on the surface you are reaching across. This will provide you more stability so that your  muscles do not fatigue as quickly. By  keeping your knees relaxed, or slightly bent, you will also reduce stress across your lower back. CORRECT LIFTING TECHNIQUES DO :   Assume a wide stance. This will provide you more stability and the opportunity to get as close as possible to the object which you are lifting.  Tense your abdominals to brace your spine. Bend at the knees and hips. Keeping your back locked in a neutral-spine position, lift using your leg muscles. Lift with your legs, keeping your back straight.  Test the weight of unknown objects before attempting to lift them.  Try to keep your elbows locked down at your sides in order get the best strength from your shoulders when carrying an object.  Always ask for help when lifting heavy or awkward objects. INCORRECT LIFTING TECHNIQUES DO NOT:   Lock your knees when lifting, even if it is a small object.  Bend and twist. Pivot at your feet or move your feet when needing to change directions.  Assume that you can safely pick up even a paper clip without proper posture. Document Released: 05/29/2005 Document Revised: 08/21/2011 Document Reviewed: 09/10/2008 [facility] Patient Information 2014 [place], [place].

## 2013-06-03 NOTE — Telephone Encounter (Signed)
Patient Information:  [name] Name: [place]  Phone: [phone]  Patient: Wendy Harrell, Wendy Harrell  Gender: Female  DOB: 1976-03-13  Age: 36 Years  PCP: Maximino Sarin  Pregnant: No  Office Follow Up:  Does the office need to follow up with this patient?: No  Instructions For The Office: N/A  RN Note:  Side effects of medication gone over with caller who was already familiar with them  Symptoms  Reason For Call & Symptoms: seen today 06/03/13 in office and given IM injection of steroids , also took Prednisone 40mg  Po QD when she got home and was to start it 06/05/23, asking if she will have a problem  Reviewed Health History In EMR: Yes  Reviewed Medications In EMR: Yes  Reviewed Allergies In EMR: Yes  Reviewed Surgeries / Procedures: Yes  Date of Onset of Symptoms: 06/03/2013 OB / GYN:  LMP: 05/12/2013  Guideline(s) Used:  No Protocol Available - Information Only  Disposition Per Guideline:   Home Care  Reason For Disposition Reached:   Information only question and nurse able to answer  Advice Given:  N/A  Patient Will Follow Care Advice:  YES

## 2013-06-03 NOTE — Progress Notes (Signed)
Pre-visit discussion using our clinic review tool. No additional management support is needed unless otherwise documented below in the visit note.  

## 2013-06-03 NOTE — Progress Notes (Signed)
   Subjective:    Patient ID: Wendy Harrell, female    DOB: March 09, 1976, 37 y.o.   MRN: [id number]  Muscle Pain This is a new problem. The current episode started in the past 7 days (1 week ago). The problem occurs constantly. The problem has been gradually worsening since onset. The pain occurs in the context of recent physical stress. The pain is present in the lower back (L buttock). The pain is severe. The symptoms are aggravated by any movement. Pertinent negatives include no abdominal pain, constipation, diarrhea, fatigue, fever, headaches, stiffness, sensory change, urinary symptoms, visual change, vomiting or wheezing. Past treatments include OTC NSAID and heat pack (using TENS unit that belongs to husband. stretches.). The treatment provided mild relief. There is no swelling present.      Review of Systems  Constitutional: Negative for fever, chills, activity change, appetite change and fatigue.  HENT: Negative for congestion.   Respiratory: Negative for cough and wheezing.   Gastrointestinal: Negative for vomiting, abdominal pain, diarrhea and constipation.  Musculoskeletal: Positive for back pain. Negative for arthralgias, gait problem and stiffness.  Neurological: Negative for sensory change and headaches.       Objective:   Physical Exam  Vitals reviewed. Constitutional: She is oriented to person, place, and time. She appears well-developed and well-nourished. No distress.  Tearful as she describes pain.  HENT:  Head: Normocephalic and atraumatic.  Eyes: Conjunctivae are normal. Right eye exhibits no discharge. Left eye exhibits no discharge.  Cardiovascular: Normal rate and regular rhythm.   No murmur heard. Pulmonary/Chest: Effort normal. No respiratory distress.  Musculoskeletal: She exhibits tenderness.  No spinal tenderness. Reproducible tenderness r low back  Neurological: She is alert and oriented to person, place, and time.  Skin: Skin is warm and dry.    Psychiatric: She has a normal mood and affect. Her behavior is normal. Thought content normal.          Assessment & Plan:  1. Back strain, initial encounter Discussed getting xrays if no better w/prednisone & stretches.- methylPREDNISolone acetate (DEPO-MEDROL) injection 40 mg; Inject 1 mL (40 mg total) into the muscle once. - predniSONE (DELTASONE) 10 MG tablet; Starting tomorrow, take 4T po qd X 2 days, then 3T po qd X 3 d, then 2T po qd X 3 d, then 1T po qd X 3 d, then discontinue.  Dispense: 26 tablet; Refill: 0 - methocarbamol (ROBAXIN) 500 MG tablet; Take 1 tablet (500 mg total) by mouth every 6 (six) hours as needed for muscle spasms.  Dispense: 30 tablet; Refill: 0

## 2013-08-29 ENCOUNTER — Ambulatory Visit (INDEPENDENT_AMBULATORY_CARE_PROVIDER_SITE_OTHER): Payer: BC Managed Care – PPO | Admitting: Family Medicine

## 2013-08-29 ENCOUNTER — Other Ambulatory Visit (HOSPITAL_COMMUNITY)
Admission: RE | Admit: 2013-08-29 | Discharge: 2013-08-29 | Disposition: A | Discharge status: Home / Self Care | Payer: BC Managed Care – PPO | Source: Ambulatory Visit | Attending: Family Medicine | Admitting: Family Medicine

## 2013-08-29 ENCOUNTER — Encounter: Payer: Self-pay | Admitting: Family Medicine

## 2013-08-29 VITALS — BP 140/70 | HR 91 | Temp 98.4°F | Ht 62.25 in | Wt 133.0 lb

## 2013-08-29 DIAGNOSIS — Z01419 Encounter for gynecological examination (general) (routine) without abnormal findings: Secondary | ICD-10-CM | POA: Insufficient documentation

## 2013-08-29 DIAGNOSIS — Z124 Encounter for screening for malignant neoplasm of cervix: Secondary | ICD-10-CM | POA: Insufficient documentation

## 2013-08-29 DIAGNOSIS — Z0000 Encounter for general adult medical examination without abnormal findings: Secondary | ICD-10-CM

## 2013-08-29 HISTORY — DX: Encounter for screening for malignant neoplasm of cervix: Z12.4

## 2013-08-29 NOTE — Assessment & Plan Note (Signed)
Pap today, no concerns on exam.  

## 2013-08-29 NOTE — Assessment & Plan Note (Signed)
Patient encouraged to maintain heart healthy diet, regular exercise, adequate sleep. Consider daily probiotics. Take medications as prescribed. Pap and breast exam today

## 2013-08-29 NOTE — Progress Notes (Signed)
Patient ID: Wendy Harrell, female   DOB: July 20, 1975, 38 y.o.   MRN: [id number] Corabelle Spackman [id number] 1975-08-11 08/29/2013      Progress Note-Follow Up  Subjective  Chief Complaint  Chief Complaint  Patient presents with  . Annual Exam    physical  . Gynecologic Exam    pap    HPI  Patient is a 38 year old female in today for routine medical care. She is doing well. No recent illness or concerns. Denies CP/palp/SOB/HA/congestion/fevers/GI or GU c/o. Taking meds as prescribed  Past Medical History  Diagnosis Date  . Chicken pox as a child  . 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 Interstitial cystitis 03/01/2011  . Dermatitis 03/01/2011    Past Surgical History  Procedure Laterality Date  . Laproscopy  2001  . Cystoscopy  2001    benign    Family History  Problem Relation Age of Onset  . Depression Father   . Heart disease Father   . Hypertension Father   . Hyperlipidemia Father   . Diabetes Father     type 2  . Arthritis Father   . Anxiety disorder Father   . Alcohol abuse Maternal Grandfather   . Depression Paternal Grandmother   . Alcohol abuse Paternal Grandmother   . Alcohol abuse Paternal Grandfather   . Heart attack Paternal Grandfather   . Cancer Maternal Grandmother     skin cancer, breast cancer    History   Social History  . Marital Status: Married    Spouse Name: N/A    Number of Children: N/A  . Years of Education: N/A   Occupational History  . Not on file.   Social History Main Topics  . Smoking status: Never Smoker   . Smokeless tobacco: Never Used  . Alcohol Use: No  . Drug Use: No  . Sexual Activity: Yes    Partners: Male   Other Topics Concern  . Not on file   Social History Narrative  . No narrative on file    Current Outpatient Prescriptions on File Prior to Visit  Medication Sig Dispense Refill  . Probiotic Product (PROBIOTIC FORMULA PO) Take by mouth.      . valACYclovir (VALTREX) 1000 MG tablet Take 1,000 mg by mouth 2  (two) times daily.       No current facility-administered medications on file prior to visit.    Allergies  Allergen Reactions  . Sulfa Antibiotics Rash    Blisters in the mouth    Review of Systems  Review of Systems  Constitutional: Negative for fever, chills and malaise/fatigue.  HENT: Negative for congestion, hearing loss and nosebleeds.   Eyes: Negative for discharge.  Respiratory: Negative for cough, sputum production, shortness of breath and wheezing.   Cardiovascular: Negative for chest pain, palpitations and leg swelling.  Gastrointestinal: Negative for heartburn, nausea, vomiting, abdominal pain, diarrhea, constipation and blood in stool.  Genitourinary: Negative for dysuria, urgency, frequency and hematuria.  Musculoskeletal: Negative for back pain, falls and myalgias.  Skin: Negative for rash.  Neurological: Negative for dizziness, tremors, sensory change, focal weakness, loss of consciousness, weakness and headaches.  Endo/Heme/Allergies: Negative for polydipsia. Does not bruise/bleed easily.  Psychiatric/Behavioral: Negative for depression and suicidal ideas. The patient is not nervous/anxious and does not have insomnia.     Objective  BP 140/70  Pulse 91  Temp(Src) 98.4 F (36.9 C) (Oral)  Ht 5' 2.25" (1.581 m)  Wt 133 lb (60.328 kg)  BMI 24.14 kg/m2  SpO2 97%  LMP 08/08/2013  Physical Exam  Physical Exam  Constitutional: She is oriented to person, place, and time and well-developed, well-nourished, and in no distress. No distress.  HENT:  Head: Normocephalic and atraumatic.  Right Ear: External ear normal.  Left Ear: External ear normal.  Nose: Nose normal.  Mouth/Throat: Oropharynx is clear and moist. No oropharyngeal exudate.  Eyes: Conjunctivae are normal. Pupils are equal, round, and reactive to light. Right eye exhibits no discharge. Left eye exhibits no discharge. No scleral icterus.  Neck: Normal range of motion. Neck supple. No thyromegaly  present.  Cardiovascular: Normal rate, regular rhythm, normal heart sounds and intact distal pulses.   No murmur heard. Pulmonary/Chest: Effort normal and breath sounds normal. No respiratory distress. She has no wheezes. She has no rales.  Abdominal: Soft. Bowel sounds are normal. She exhibits no distension and no mass. There is no tenderness.  Genitourinary: Vagina normal, uterus normal, cervix normal, right adnexa normal and left adnexa normal. No vaginal discharge found.  Musculoskeletal: Normal range of motion. She exhibits no edema and no tenderness.  Lymphadenopathy:    She has no cervical adenopathy.  Neurological: She is alert and oriented to person, place, and time. She has normal reflexes. No cranial nerve deficit. Coordination normal.  Skin: Skin is warm and dry. No rash noted. She is not diaphoretic.  Psychiatric: Mood, memory and affect normal.      Assessment & Plan  Preventative health care Patient encouraged to maintain heart healthy diet, regular exercise, adequate sleep. Consider daily probiotics. Take medications as prescribed. Pap and breast exam today  Cervical cancer screening Pap today, no concerns on exam.

## 2013-08-29 NOTE — Patient Instructions (Signed)

## 2013-08-29 NOTE — Progress Notes (Signed)
Pre visit review using our clinic review tool, if applicable. No additional management support is needed unless otherwise documented below in the visit note. 

## 2013-09-05 ENCOUNTER — Encounter: Payer: Self-pay | Admitting: *Deleted

## 2014-02-12 ENCOUNTER — Ambulatory Visit (INDEPENDENT_AMBULATORY_CARE_PROVIDER_SITE_OTHER): Payer: BC Managed Care – PPO | Admitting: Nurse Practitioner

## 2014-02-12 ENCOUNTER — Encounter: Payer: Self-pay | Admitting: Nurse Practitioner

## 2014-02-12 VITALS — BP 116/74 | HR 96 | Temp 98.0°F | Resp 16 | Ht 62.25 in | Wt 133.0 lb

## 2014-02-12 DIAGNOSIS — L293 Anogenital pruritus, unspecified: Secondary | ICD-10-CM

## 2014-02-12 DIAGNOSIS — N898 Other specified noninflammatory disorders of vagina: Secondary | ICD-10-CM

## 2014-02-12 LAB — POCT WET PREP WITH KOH
Clue Cells Wet Prep HPF POC: NEGATIVE
KOH PREP POC: POSITIVE
RBC Wet Prep HPF POC: NEGATIVE
Trichomonas, UA: NEGATIVE
Yeast Wet Prep HPF POC: POSITIVE

## 2014-02-12 MED ORDER — FLUCONAZOLE 150 MG PO TABS: 150.0000 mg | ORAL_TABLET | Freq: Once | ORAL | Status: DC

## 2014-02-12 NOTE — Progress Notes (Signed)
   Subjective:    Patient ID: Wendy Harrell, female    DOB: 19-Mar-1976, 38 y.o.   MRN: [id number]  Vaginal Itching The patient's primary symptoms include genital itching. The patient's pertinent negatives include no pelvic pain or vaginal discharge. This is a new problem. The current episode started in the past 7 days (6d). The problem occurs constantly. The problem has been gradually worsening. The patient is experiencing no pain. The problem affects both sides. She is not pregnant. Pertinent negatives include no abdominal pain, back pain, chills, diarrhea, dysuria, fever, flank pain, frequency, nausea, rash, sore throat, urgency or vomiting. Nothing aggravates the symptoms. Treatments tried: monistat suppsitory. She is sexually active (same partner, last intercourse 2w eeks ago). No, her partner does not have an STD. Her menstrual history has been regular. (ICS)      Review of Systems  Constitutional: Negative for fever, chills and fatigue.  HENT: Negative for sore throat.   Gastrointestinal: Negative for nausea, vomiting, abdominal pain and diarrhea.  Genitourinary: Positive for vaginal pain. Negative for dysuria, urgency, frequency, flank pain, vaginal discharge, genital sores, menstrual problem and pelvic pain.  Musculoskeletal: Negative for back pain.  Skin: Negative for rash.       Objective:   Physical Exam  Vitals reviewed. Constitutional: She is oriented to person, place, and time. She appears well-developed and well-nourished. No distress.  HENT:  Head: Normocephalic and atraumatic.  Eyes: Conjunctivae are normal. Right eye exhibits no discharge. Left eye exhibits no discharge.  Cardiovascular: Normal rate.   Pulmonary/Chest: Effort normal. No respiratory distress.  Genitourinary:  Labia erythematous, thin white d/c.  Neurological: She is alert and oriented to person, place, and time.  Skin: Skin is warm and dry.  Psychiatric: She has a normal mood and affect. Her  behavior is normal. Thought content normal.          Assessment & Plan:  1. Vagina itching - GC/Chlamydia Probe Amp - POCT Wet Prep with KOH-few yeast buds, no clue or trich - fluconazole (DIFLUCAN) 150 MG tablet; Take 1 tablet (150 mg total) by mouth once.  Dispense: 1 tablet; Refill: 1 See pt instructions. F/U PRN

## 2014-02-12 NOTE — Patient Instructions (Signed)
No soaps for 5 to 7 days, just water rinse. Than mild soap only-Dove bar soap.  It appears you have yeast. Take diflucan. You may be sensitive to inactive components in monistat suppositories.  If still having symptoms in 7 days, repeat dose.  If no resolve in 2 weeks, come back.

## 2014-02-12 NOTE — Progress Notes (Signed)
Pre visit review using our clinic review tool, if applicable. No additional management support is needed unless otherwise documented below in the visit note. 

## 2014-02-13 ENCOUNTER — Telehealth: Payer: Self-pay | Admitting: Nurse Practitioner

## 2014-02-13 LAB — GC/CHLAMYDIA PROBE AMP
CT PROBE, AMP APTIMA: NEGATIVE
GC PROBE AMP APTIMA: NEGATIVE

## 2014-02-13 NOTE — Telephone Encounter (Signed)
LMOM labs neg.

## 2014-02-13 NOTE — Telephone Encounter (Signed)
pls call pt: Advise  Gc neg.

## 2014-08-05 ENCOUNTER — Telehealth: Payer: Self-pay | Admitting: Family Medicine

## 2014-08-05 NOTE — Telephone Encounter (Signed)
I am happy to make the referral but I have not seen her in a year and I feel like I need to see her. Check some labs including a TSH. Compare weights and do an exam to justify a referral

## 2014-08-05 NOTE — Telephone Encounter (Signed)
Patient has 13 pounds in last 5 months. Patient has chronic diarrhea. Patient is requesting GI referral with Dr. Danise EdgeMartin Johnson, [facility] [phone].

## 2014-08-06 NOTE — Telephone Encounter (Signed)
She is not obligated to do labs, what I usually do for a healthy 30-40 is a lipid, cmp, cbc, tsh every couple of years, if she is asymptomatic this is unnecessary. If she would like to do them someone else is going to have to tell her how much these things will cost.

## 2014-08-06 NOTE — Telephone Encounter (Signed)
Left a detail message stating patient's need for an appointment.  When patient calls back, please schedule a follow up appointment with Dr. Abner GreenspanBlyth.

## 2014-08-06 NOTE — Telephone Encounter (Signed)
So I realize now she is the patient with the diarrhea and weight loss. i would suggest she have a cmp, cbc blood work wise. Depending on the severity of weight loss and diarrhea and if she has taken antibiotics I would get stool samples to include CDiff by PCR (abx use), stool culture, stool for o & p and stool for WBC

## 2014-08-06 NOTE — Telephone Encounter (Signed)
Appointment scheduled for 08/13/14 at 4:00 pm with Dr. Abner GreenspanBlyth.   She would also like to know what labs and the cost.  They are currently having to pay out of pocket.  Please advise.

## 2014-08-07 NOTE — Telephone Encounter (Signed)
Patient returned phone call. [facility] # [phone]

## 2014-08-07 NOTE — Telephone Encounter (Signed)
Left a message for call back.  

## 2014-08-10 NOTE — Telephone Encounter (Signed)
Left a message for call back.  

## 2014-08-10 NOTE — Telephone Encounter (Signed)
Happy to run a TSH

## 2014-08-10 NOTE — Telephone Encounter (Signed)
Dr. Mariel AloeBlyth's note reviewed with patient.  She stated understanding and agreed with plan.  Pt's main concern was whether or not our labs were sent to local laboratories.  Pt was made aware that they were.  She shared that she would also like a TSH level drawn due to family history and said that she would discuss this with Dr. Abner GreenspanBlyth during her visit.

## 2014-08-13 ENCOUNTER — Encounter: Payer: Self-pay | Admitting: Family Medicine

## 2014-08-13 ENCOUNTER — Ambulatory Visit (INDEPENDENT_AMBULATORY_CARE_PROVIDER_SITE_OTHER): Payer: BLUE CROSS/BLUE SHIELD | Admitting: Family Medicine

## 2014-08-13 VITALS — BP 110/78 | HR 112 | Temp 98.4°F | Ht 62.0 in | Wt 125.4 lb

## 2014-08-13 DIAGNOSIS — G8929 Other chronic pain: Secondary | ICD-10-CM

## 2014-08-13 DIAGNOSIS — R634 Abnormal weight loss: Secondary | ICD-10-CM

## 2014-08-13 DIAGNOSIS — R197 Diarrhea, unspecified: Secondary | ICD-10-CM

## 2014-08-13 DIAGNOSIS — R1013 Epigastric pain: Secondary | ICD-10-CM

## 2014-08-13 NOTE — Patient Instructions (Signed)
[email or web address], probiotic multistrain  Diarrhea Diarrhea is frequent loose and watery bowel movements. It can cause you to feel weak and dehydrated. Dehydration can cause you to become tired and thirsty, have a dry mouth, and have decreased urination that often is dark yellow. Diarrhea is a sign of another problem, most often an infection that will not last long. In most cases, diarrhea typically lasts 2-3 days. However, it can last longer if it is a sign of something more serious. It is important to treat your diarrhea as directed by your caregiver to lessen or prevent future episodes of diarrhea. CAUSES  Some common causes include:  Gastrointestinal infections caused by viruses, bacteria, or parasites.  Food poisoning or food allergies.  Certain medicines, such as antibiotics, chemotherapy, and laxatives.  Artificial sweeteners and fructose.  Digestive disorders. HOME CARE INSTRUCTIONS  Ensure adequate fluid intake (hydration): Have 1 cup (8 oz) of fluid for each diarrhea episode. Avoid fluids that contain simple sugars or sports drinks, fruit juices, whole milk products, and sodas. Your urine should be clear or pale yellow if you are drinking enough fluids. Hydrate with an oral rehydration solution that you can purchase at pharmacies, retail stores, and online. You can prepare an oral rehydration solution at home by mixing the following ingredients together:   - tsp table salt.   tsp baking soda.   tsp salt substitute containing potassium chloride.  1  tablespoons sugar.  1 L (34 oz) of water.  Certain foods and beverages may increase the speed at which food moves through the gastrointestinal (GI) tract. These foods and beverages should be avoided and include:  Caffeinated and alcoholic beverages.  High-fiber foods, such as raw fruits and vegetables, nuts, seeds, and whole grain breads and cereals.  Foods and beverages sweetened with sugar alcohols, such as  xylitol, sorbitol, and mannitol.  Some foods may be well tolerated and may help thicken stool including:  Starchy foods, such as rice, toast, pasta, low-sugar cereal, oatmeal, grits, baked potatoes, crackers, and bagels.  Bananas.  Applesauce.  Add probiotic-rich foods to help increase healthy bacteria in the GI tract, such as yogurt and fermented milk products.  Wash your hands well after each diarrhea episode.  Only take over-the-counter or prescription medicines as directed by your caregiver.  Take a warm bath to relieve any burning or pain from frequent diarrhea episodes. SEEK IMMEDIATE MEDICAL CARE IF:   You are unable to keep fluids down.  You have persistent vomiting.  You have blood in your stool, or your stools are black and tarry.  You do not urinate in 6-8 hours, or there is only a small amount of very dark urine.  You have abdominal pain that increases or localizes.  You have weakness, dizziness, confusion, or light-headedness.  You have a severe headache.  Your diarrhea gets worse or does not get better.  You have a fever or persistent symptoms for more than 2-3 days.  You have a fever and your symptoms suddenly get worse. MAKE SURE YOU:   Understand these instructions.  Will watch your condition.  Will get help right away if you are not doing well or get worse. Document Released: 05/19/2002 Document Revised: 10/13/2013 Document Reviewed: 02/04/2012 [facility] Patient Information 2015 [employer] [place]. This information is not intended to replace advice given to you by your health care provider. Make sure you discuss any questions you have with your health care provider.

## 2014-08-13 NOTE — Progress Notes (Signed)
Pre visit review using our clinic review tool, if applicable. No additional management support is needed unless otherwise documented below in the visit note. 

## 2014-08-14 LAB — CBC
HEMATOCRIT: 39.1 % (ref 36.0–46.0)
Hemoglobin: 13.4 g/dL (ref 12.0–15.0)
MCHC: 34.4 g/dL (ref 30.0–36.0)
MCV: 91.7 fl (ref 78.0–100.0)
Platelets: 259 10*3/uL (ref 150.0–400.0)
RBC: 4.27 Mil/uL (ref 3.87–5.11)
RDW: 12.6 % (ref 11.5–15.5)
WBC: 7.1 10*3/uL (ref 4.0–10.5)

## 2014-08-14 LAB — COMPREHENSIVE METABOLIC PANEL
ALT: 12 U/L (ref 0–35)
AST: 14 U/L (ref 0–37)
Albumin: 4.6 g/dL (ref 3.5–5.2)
Alkaline Phosphatase: 65 U/L (ref 39–117)
BILIRUBIN TOTAL: 0.4 mg/dL (ref 0.2–1.2)
BUN: 11 mg/dL (ref 6–23)
CALCIUM: 9.8 mg/dL (ref 8.4–10.5)
CO2: 30 mEq/L (ref 19–32)
CREATININE: 0.69 mg/dL (ref 0.40–1.20)
Chloride: 103 mEq/L (ref 96–112)
GFR: 100.96 mL/min (ref >60.00)
Glucose, Bld: 87 mg/dL (ref 70–99)
Potassium: 4.5 mEq/L (ref 3.5–5.1)
Sodium: 139 mEq/L (ref 135–145)
Total Protein: 7.6 g/dL (ref 6.0–8.3)

## 2014-08-14 LAB — TSH: TSH: 1.46 u[IU]/mL (ref 0.35–4.50)

## 2014-08-14 LAB — H. PYLORI ANTIBODY, IGG: H Pylori IgG: NEGATIVE

## 2014-08-16 ENCOUNTER — Other Ambulatory Visit: Payer: Self-pay | Admitting: Family Medicine

## 2014-08-16 MED ORDER — VALACYCLOVIR HCL 1 G PO TABS: 1000.0000 mg | ORAL_TABLET | Freq: Two times a day (BID) | ORAL | Status: DC | PRN

## 2014-08-19 LAB — OVA AND PARASITE EXAMINATION: OP: NONE SEEN

## 2014-08-19 LAB — CLOSTRIDIUM DIFFICILE BY PCR: CDIFFPCR: NOT DETECTED

## 2014-08-20 LAB — FECAL LACTOFERRIN, QUANT: LACTOFERRIN: NEGATIVE

## 2014-08-22 LAB — STOOL CULTURE

## 2014-08-23 ENCOUNTER — Encounter: Payer: Self-pay | Admitting: Family Medicine

## 2014-08-23 DIAGNOSIS — R197 Diarrhea, unspecified: Secondary | ICD-10-CM | POA: Insufficient documentation

## 2014-08-23 DIAGNOSIS — R634 Abnormal weight loss: Secondary | ICD-10-CM

## 2014-08-23 HISTORY — DX: Abnormal weight loss: R63.4

## 2014-08-23 NOTE — Progress Notes (Signed)
Wendy Harrell  [id number] 03-31-1976 08/23/2014      Progress Note-Follow Up  Subjective  Chief Complaint  Chief Complaint  Patient presents with  . Weight Loss  . Diarrhea    HPI  Patient is a 39 y.o. female in today for routine medical care. Patient is in today for evaluation of chronic diarrhea. She has not been seen in about a year but notes over that year she has had persistent loose stool. She will have sometimes just one episode but other days up to 5 in a given day. Has some intermittent right upper quadrant pain as well. Denies any bloody or tarry stool. Does note years ago she had trouble constipation. She had been using prunes for the constipation she had with her daughter about 4 years ago upon delivery. Did receive antibiotics at that delivery. She has noted weight loss this past year as well. Denies night sweats or anorexia. Does note some right shoulder pain at times. No correlation with meals.  Past Medical History  Diagnosis Date  . Chicken pox as a child  . 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 Interstitial cystitis 03/01/2011  . Dermatitis 03/01/2011  . Cervical cancer screening 08/29/2013  . Diarrhea 08/23/2014  . Loss of weight 08/23/2014    Past Surgical History  Procedure Laterality Date  . Laproscopy  2001  . Cystoscopy  2001    benign    Family History  Problem Relation Age of Onset  . Depression Father   . Heart disease Father   . Hypertension Father   . Hyperlipidemia Father   . Diabetes Father     type 2  . Arthritis Father   . Anxiety disorder Father   . Alcohol abuse Maternal Grandfather   . Depression Paternal Grandmother   . Alcohol abuse Paternal Grandmother   . Alcohol abuse Paternal Grandfather   . Heart attack Paternal Grandfather   . Cancer Maternal Grandmother     skin cancer, breast cancer  . Alzheimer's disease Maternal Aunt     History   Social History  . Marital Status: Married    Spouse Name: N/A  . Number of Children: N/A   . Years of Education: N/A   Occupational History  . Not on file.   Social History Main Topics  . Smoking status: Never Smoker   . Smokeless tobacco: Never Used  . Alcohol Use: No  . Drug Use: No  . Sexual Activity:    Partners: Male   Other Topics Concern  . Not on file   Social History Narrative    Current Outpatient Prescriptions on File Prior to Visit  Medication Sig Dispense Refill  . Probiotic Product (PROBIOTIC FORMULA PO) Take by mouth.     No current facility-administered medications on file prior to visit.    Allergies  Allergen Reactions  . Sulfa Antibiotics Rash    Blisters in the mouth    Review of Systems  Review of Systems  Constitutional: Negative for fever and malaise/fatigue.  HENT: Negative for congestion.   Eyes: Negative for discharge.  Respiratory: Negative for shortness of breath.   Cardiovascular: Negative for chest pain, palpitations and leg swelling.  Gastrointestinal: Positive for diarrhea. Negative for nausea, abdominal pain, constipation, blood in stool and melena.  Genitourinary: Negative for dysuria.  Musculoskeletal: Negative for falls.  Skin: Positive for rash.  Neurological: Negative for loss of consciousness and headaches.  Endo/Heme/Allergies: Negative for polydipsia.  Psychiatric/Behavioral: Negative for depression and suicidal ideas. The patient  is not nervous/anxious and does not have insomnia.     Objective  BP 110/78 mmHg  Pulse 112  Temp(Src) 98.4 F (36.9 C) (Oral)  Ht  (1.575 m)  Wt 125 lb 6 oz (56.87 kg)  BMI 22.93 kg/m2  SpO2 97%  Physical Exam  Physical Exam  Constitutional: She is oriented to person, place, and time and well-developed, well-nourished, and in no distress. No distress.  HENT:  Head: Normocephalic and atraumatic.  Eyes: Conjunctivae are normal.  Neck: Neck supple. No thyromegaly present.  Cardiovascular: Normal rate, regular rhythm and normal heart sounds.   No murmur  heard. Pulmonary/Chest: Effort normal and breath sounds normal. She has no wheezes.  Abdominal: She exhibits no distension and no mass.  Musculoskeletal: She exhibits no edema.  Lymphadenopathy:    She has no cervical adenopathy.  Neurological: She is alert and oriented to person, place, and time.  Skin: Skin is warm and dry. No rash noted. She is not diaphoretic.  Psychiatric: Memory, affect and judgment normal.    Lab Results  Component Value Date   TSH 1.46 08/13/2014   Lab Results  Component Value Date   WBC 7.1 08/13/2014   HGB 13.4 08/13/2014   HCT 39.1 08/13/2014   MCV 91.7 08/13/2014   PLT 259.0 08/13/2014   Lab Results  Component Value Date   CREATININE 0.69 08/13/2014   BUN 11 08/13/2014   NA 139 08/13/2014   K 4.5 08/13/2014   CL 103 08/13/2014   CO2 30 08/13/2014   Lab Results  Component Value Date   ALT 12 08/13/2014   AST 14 08/13/2014   ALKPHOS 65 08/13/2014   BILITOT 0.4 08/13/2014   No results found for: CHOL No results found for: HDL No results found for: LDLCALC No results found for: TRIG No results found for: CHOLHDL   Assessment & Plan  Diarrhea Blood work and stool testing revealed yeast overgrowth in stool. Will need to start probiotics and minimize simple carbs. Started on Diflucan and if no imrpovement will need further work up.   Loss of weight Encouraged increased protein and fiber in diet, will monitor, if persists with treatment of yeast will need further work up

## 2014-08-23 NOTE — Assessment & Plan Note (Signed)
Blood work and stool testing revealed yeast overgrowth in stool. Will need to start probiotics and minimize simple carbs. Started on Diflucan and if no imrpovement will need further work up.

## 2014-08-23 NOTE — Assessment & Plan Note (Signed)
Encouraged increased protein and fiber in diet, will monitor, if persists with treatment of yeast will need further work up

## 2014-08-24 ENCOUNTER — Other Ambulatory Visit: Payer: Self-pay | Admitting: Family Medicine

## 2014-08-24 MED ORDER — FLUCONAZOLE 150 MG PO TABS: ORAL_TABLET | ORAL | Status: DC

## 2014-08-27 ENCOUNTER — Telehealth: Payer: Self-pay | Admitting: Family Medicine

## 2014-08-27 NOTE — Telephone Encounter (Signed)
[name] name: Irene PapFulton, [place] Relation to pt: self  Call back number: [phone]   Reason for call:  Pt would like to discuss fluconazole (DIFLUCAN) 150 MG tablet regarding the direction and pt states she is expercicing itchy scalp and would like to know if medication can be used to treat scalp as well. Please advise

## 2014-08-27 NOTE — Telephone Encounter (Signed)
Patient does not belong to me.  I was not in office today.  Will forward to the PCP.

## 2014-08-27 NOTE — Telephone Encounter (Signed)
Not sure what her question is about the directions on her Diflucan please find out. It can be used to treat some funguses so if some of her itchy scalp is caused by that it might help slightly

## 2014-08-27 NOTE — Telephone Encounter (Signed)
Please advise 

## 2014-08-28 NOTE — Telephone Encounter (Signed)
Patient informed of PCP instructions. 

## 2014-08-28 NOTE — Telephone Encounter (Signed)
Pt is returning your call please call back

## 2014-08-28 NOTE — Telephone Encounter (Signed)
Called left message to call back 

## 2014-08-28 NOTE — Telephone Encounter (Signed)
I recommend wait one week to start the weekly

## 2014-08-28 NOTE — Telephone Encounter (Signed)
The patient completed diflucan 2 by mouth for 3 days, her question now is when to start the 1 weekly for 4 weeks, the next day after the 3 days or wait a week.  Also is there something to use in addition to diflucan for her itchy scalp.   Ok to leave a detailed message of PCP's response.

## 2014-09-02 ENCOUNTER — Encounter: Payer: Self-pay | Admitting: Nurse Practitioner

## 2014-09-02 ENCOUNTER — Ambulatory Visit (INDEPENDENT_AMBULATORY_CARE_PROVIDER_SITE_OTHER): Payer: BLUE CROSS/BLUE SHIELD | Admitting: Nurse Practitioner

## 2014-09-02 ENCOUNTER — Telehealth: Payer: Self-pay | Admitting: Family Medicine

## 2014-09-02 ENCOUNTER — Other Ambulatory Visit: Payer: Self-pay | Admitting: Nurse Practitioner

## 2014-09-02 VITALS — BP 100/67 | HR 96 | Temp 98.3°F | Ht 62.0 in | Wt 125.0 lb

## 2014-09-02 DIAGNOSIS — N6310 Unspecified lump in the right breast, unspecified quadrant: Secondary | ICD-10-CM

## 2014-09-02 DIAGNOSIS — Z0000 Encounter for general adult medical examination without abnormal findings: Secondary | ICD-10-CM | POA: Diagnosis not present

## 2014-09-02 LAB — TSH: TSH: 1.71 u[IU]/mL (ref 0.35–4.50)

## 2014-09-02 LAB — T4, FREE: Free T4: 0.8 ng/dL (ref 0.60–1.60)

## 2014-09-02 LAB — LIPID PANEL
Cholesterol: 190 mg/dL (ref 0–200)
HDL: 58.1 mg/dL (ref >39.00)
LDL Cholesterol: 121 mg/dL — ABNORMAL HIGH (ref 0–99)
NonHDL: 131.9
Total CHOL/HDL Ratio: 3
Triglycerides: 53 mg/dL (ref 0.0–149.0)
VLDL: 10.6 mg/dL (ref 0.0–40.0)

## 2014-09-02 LAB — VITAMIN D 25 HYDROXY (VIT D DEFICIENCY, FRACTURES): VITD: 21.3 ng/mL — ABNORMAL LOW (ref 30.00–100.00)

## 2014-09-02 NOTE — Progress Notes (Signed)
Pre visit review using our clinic review tool, if applicable. No additional management support is needed unless otherwise documented below in the visit note. 

## 2014-09-02 NOTE — Patient Instructions (Signed)
Our office will call you with lab results and any necessary follow up.  Develop lifelong habits of exercise most days of the week: take a 30 minute walk. The benefits include weight loss, lower risk for heart disease, diabetes, stroke, high blood pressure, lower rates of depression & dementia, better sleep quality & bone health. refills  For best health, minimize refined sugar:anything that is sweet when you eat or drink it except fresh fruit.  Minimize refined flours : white bread, rolls, biscuits, bagels, muffins, pasta and cereals. Breads & cereals that have 4 gm or more of fiber per serving are good.  Nice to see you!  Preventive Care for Adults, Female A healthy lifestyle and preventive care can promote health and wellness. Preventive health guidelines for women include the following key practices.  A routine yearly physical is a good way to check with your caregiver about your health and preventive screening. It is a chance to share any concerns and updates on your health, and to receive a thorough exam.  Visit your dentist for a routine exam and preventive care every 6 months. Brush your teeth twice a day and floss once a day. Good oral hygiene prevents tooth decay and gum disease.  The frequency of eye exams is based on your age, health, family medical history, use of contact lenses, and other factors. Follow your caregiver's recommendations for frequency of eye exams.  Eat a healthy diet. Foods like vegetables, fruits, whole grains, low-fat dairy products, and lean protein foods contain the nutrients you need without too many calories. Decrease your intake of foods high in solid fats, added sugars, and salt. Eat the right amount of calories for you.Get information about a proper diet from your caregiver, if necessary.  Regular physical exercise is one of the most important things you can do for your health. Most adults should get at least 150 minutes of moderate-intensity exercise (any  activity that increases your heart rate and causes you to sweat) each week. In addition, most adults need muscle-strengthening exercises on 2 or more days a week.  Maintain a healthy weight. The body mass index (BMI) is a screening tool to identify possible weight problems. It provides an estimate of body fat based on height and weight. Your caregiver can help determine your BMI, and can help you achieve or maintain a healthy weight.For adults 20 years and older:  A BMI below 18.5 is considered underweight.  A BMI of 18.5 to 24.9 is normal.  A BMI of 25 to 29.9 is considered overweight.  A BMI of 30 and above is considered obese.  Maintain normal blood lipids and cholesterol levels by exercising and minimizing your intake of saturated fat. Eat a balanced diet with plenty of fruit and vegetables. Blood tests for lipids and cholesterol should begin at age 37 and be repeated every 5 years. If your lipid or cholesterol levels are high, you are over 50, or you are at high risk for heart disease, you may need your cholesterol levels checked more frequently.Ongoing high lipid and cholesterol levels should be treated with medicines if diet and exercise are not effective.  If you smoke, find out from your caregiver how to quit. If you do not use tobacco, do not start.  Lung cancer screening is recommended for adults aged 14 80 years who are at high risk for developing lung cancer because of a history of smoking. Yearly low-dose computed tomography (CT) is recommended for people who have at least a 30-pack-year  history of smoking and are a current smoker or have quit within the past 15 years. A pack year of smoking is smoking an average of 1 pack of cigarettes a day for 1 year (for example: 1 pack a day for 30 years or 2 packs a day for 15 years). Yearly screening should continue until the smoker has stopped smoking for at least 15 years. Yearly screening should also be stopped for people who develop a  health problem that would prevent them from having lung cancer treatment.  If you are pregnant, do not drink alcohol. If you are breastfeeding, be very cautious about drinking alcohol. If you are not pregnant and choose to drink alcohol, do not exceed 1 drink per day. One drink is considered to be 12 ounces (355 mL) of beer, 5 ounces (148 mL) of wine, or 1.5 ounces (44 mL) of liquor.  Avoid use of street drugs. Do not share needles with anyone. Ask for help if you need support or instructions about stopping the use of drugs.  High blood pressure causes heart disease and increases the risk of stroke. Your blood pressure should be checked at least every 1 to 2 years. Ongoing high blood pressure should be treated with medicines if weight loss and exercise are not effective.  If you are 11 to 39 years old, ask your caregiver if you should take aspirin to prevent strokes.  Diabetes screening involves taking a blood sample to check your fasting blood sugar level. This should be done once every 3 years, after age 59, if you are within normal weight and without risk factors for diabetes. Testing should be considered at a younger age or be carried out more frequently if you are overweight and have at least 1 risk factor for diabetes.  Breast cancer screening is essential preventive care for women. You should practice "breast self-awareness." This means understanding the normal appearance and feel of your breasts and may include breast self-examination. Any changes detected, no matter how small, should be reported to a caregiver. Women in their 18s and 30s should have a clinical breast exam (CBE) by a caregiver as part of a regular health exam every 1 to 3 years. After age 65, women should have a CBE every year. Starting at age 30, women should consider having a mammography (breast X-ray test) every year. Women who have a family history of breast cancer should talk to their caregiver about genetic screening. Women  at a high risk of breast cancer should talk to their caregivers about having magnetic resonance imaging (MRI) and a mammography every year.  Breast cancer gene (BRCA)-related cancer risk assessment is recommended for women who have family members with BRCA-related cancers. BRCA-related cancers include breast, ovarian, tubal, and peritoneal cancers. Having family members with these cancers may be associated with an increased risk for harmful changes (mutations) in the breast cancer genes BRCA1 and BRCA2. Results of the assessment will determine the need for genetic counseling and BRCA1 and BRCA2 testing.  The Pap test is a screening test for cervical cancer. A Pap test can show cell changes on the cervix that might become cervical cancer if left untreated. A Pap test is a procedure in which cells are obtained and examined from the lower end of the uterus (cervix).  Women should have a Pap test starting at age 65.  Between ages 68 and 77, Pap tests should be repeated every 2 years.  Beginning at age 42, you should have a Pap test  every 3 years as long as the past 3 Pap tests have been normal.  Some women have medical problems that increase the chance of getting cervical cancer. Talk to your caregiver about these problems. It is especially important to talk to your caregiver if a new problem develops soon after your last Pap test. In these cases, your caregiver may recommend more frequent screening and Pap tests.  The above recommendations are the same for women who have or have not gotten the vaccine for human papillomavirus (HPV).  If you had a hysterectomy for a problem that was not cancer or a condition that could lead to cancer, then you no longer need Pap tests. Even if you no longer need a Pap test, a regular exam is a good idea to make sure no other problems are starting.  If you are between ages 33 and 78, and you have had normal Pap tests going back 10 years, you no longer need Pap tests.  Even if you no longer need a Pap test, a regular exam is a good idea to make sure no other problems are starting.  If you have had past treatment for cervical cancer or a condition that could lead to cancer, you need Pap tests and screening for cancer for at least 20 years after your treatment.  If Pap tests have been discontinued, risk factors (such as a new sexual partner) need to be reassessed to determine if screening should be resumed.  The HPV test is an additional test that may be used for cervical cancer screening. The HPV test looks for the virus that can cause the cell changes on the cervix. The cells collected during the Pap test can be tested for HPV. The HPV test could be used to screen women aged 67 years and older, and should be used in women of any age who have unclear Pap test results. After the age of 76, women should have HPV testing at the same frequency as a Pap test.  Colorectal cancer can be detected and often prevented. Most routine colorectal cancer screening begins at the age of 72 and continues through age 28. However, your caregiver may recommend screening at an earlier age if you have risk factors for colon cancer. On a yearly basis, your caregiver may provide home test kits to check for hidden blood in the stool. Use of a small camera at the end of a tube, to directly examine the colon (sigmoidoscopy or colonoscopy), can detect the earliest forms of colorectal cancer. Talk to your caregiver about this at age 52, when routine screening begins. Direct examination of the colon should be repeated every 5 to 10 years through age 78, unless early forms of pre-cancerous polyps or small growths are found.  Hepatitis C blood testing is recommended for all people born from [birth year] through 1965 and any individual with known risks for hepatitis C.  Practice safe sex. Use condoms and avoid high-risk sexual practices to reduce the spread of sexually transmitted infections (STIs). STIs  include gonorrhea, chlamydia, syphilis, trichomonas, herpes, HPV, and human immunodeficiency virus (HIV). Herpes, HIV, and HPV are viral illnesses that have no cure. They can result in disability, cancer, and death. Sexually active women aged 38 and younger should be checked for chlamydia. Older women with new or multiple partners should also be tested for chlamydia. Testing for other STIs is recommended if you are sexually active and at increased risk.  Osteoporosis is a disease in which the bones lose minerals  and strength with aging. This can result in serious bone fractures. The risk of osteoporosis can be identified using a bone density scan. Women ages 52 and over and women at risk for fractures or osteoporosis should discuss screening with their caregivers. Ask your caregiver whether you should take a calcium supplement or vitamin D to reduce the rate of osteoporosis.  Menopause can be associated with physical symptoms and risks. Hormone replacement therapy is available to decrease symptoms and risks. You should talk to your caregiver about whether hormone replacement therapy is right for you.  Use sunscreen. Apply sunscreen liberally and repeatedly throughout the day. You should seek shade when your shadow is shorter than you. Protect yourself by wearing long sleeves, pants, a wide-brimmed hat, and sunglasses year round, whenever you are outdoors.  Once a month, do a whole body skin exam, using a mirror to look at the skin on your back. Notify your caregiver of new moles, moles that have irregular borders, moles that are larger than a pencil eraser, or moles that have changed in shape or color.  Stay current with required immunizations.  Influenza vaccine. All adults should be immunized every year.  Tetanus, diphtheria, and acellular pertussis (Td, Tdap) vaccine. Pregnant women should receive 1 dose of Tdap vaccine during each pregnancy. The dose should be obtained regardless of the length of  time since the last dose. Immunization is preferred during the 27th to 36th week of gestation. An adult who has not previously received Tdap or who does not know her vaccine status should receive 1 dose of Tdap. This initial dose should be followed by tetanus and diphtheria toxoids (Td) booster doses every 10 years. Adults with an unknown or incomplete history of completing a 3-dose immunization series with Td-containing vaccines should begin or complete a primary immunization series including a Tdap dose. Adults should receive a Td booster every 10 years.  Varicella vaccine. An adult without evidence of immunity to varicella should receive 2 doses or a second dose if she has previously received 1 dose. Pregnant females who do not have evidence of immunity should receive the first dose after pregnancy. This first dose should be obtained before leaving the health care facility. The second dose should be obtained 4 8 weeks after the first dose.  Human papillomavirus (HPV) vaccine. Females aged 58 26 years who have not received the vaccine previously should obtain the 3-dose series. The vaccine is not recommended for use in pregnant females. However, pregnancy testing is not needed before receiving a dose. If a female is found to be pregnant after receiving a dose, no treatment is needed. In that case, the remaining doses should be delayed until after the pregnancy. Immunization is recommended for any person with an immunocompromised condition through the age of 6 years if she did not get any or all doses earlier. During the 3-dose series, the second dose should be obtained 4 8 weeks after the first dose. The third dose should be obtained 24 weeks after the first dose and 16 weeks after the second dose.  Zoster vaccine. One dose is recommended for adults aged 72 years or older unless certain conditions are present.  Measles, mumps, and rubella (MMR) vaccine. Adults born before 4 generally are considered  immune to measles and mumps. Adults born in [birth year] or later should have 1 or more doses of MMR vaccine unless there is a contraindication to the vaccine or there is laboratory evidence of immunity to each of the three diseases.  A routine second dose of MMR vaccine should be obtained at least 28 days after the first dose for students attending postsecondary schools, health care workers, or international travelers. People who received inactivated measles vaccine or an unknown type of measles vaccine during 1963 1967 should receive 2 doses of MMR vaccine. People who received inactivated mumps vaccine or an unknown type of mumps vaccine before 1979 and are at high risk for mumps infection should consider immunization with 2 doses of MMR vaccine. For females of childbearing age, rubella immunity should be determined. If there is no evidence of immunity, females who are not pregnant should be vaccinated. If there is no evidence of immunity, females who are pregnant should delay immunization until after pregnancy. Unvaccinated health care workers born before 52 who lack laboratory evidence of measles, mumps, or rubella immunity or laboratory confirmation of disease should consider measles and mumps immunization with 2 doses of MMR vaccine or rubella immunization with 1 dose of MMR vaccine.  Pneumococcal 13-valent conjugate (PCV13) vaccine. When indicated, a person who is uncertain of her immunization history and has no record of immunization should receive the PCV13 vaccine. An adult aged 75 years or older who has certain medical conditions and has not been previously immunized should receive 1 dose of PCV13 vaccine. This PCV13 should be followed with a dose of pneumococcal polysaccharide (PPSV23) vaccine. The PPSV23 vaccine dose should be obtained at least 8 weeks after the dose of PCV13 vaccine. An adult aged 24 years or older who has certain medical conditions and previously received 1 or more doses of PPSV23 vaccine  should receive 1 dose of PCV13. The PCV13 vaccine dose should be obtained 1 or more years after the last PPSV23 vaccine dose.  Pneumococcal polysaccharide (PPSV23) vaccine. When PCV13 is also indicated, PCV13 should be obtained first. All adults aged 27 years and older should be immunized. An adult younger than age 8 years who has certain medical conditions should be immunized. Any person who resides in a nursing home or long-term care facility should be immunized. An adult smoker should be immunized. People with an immunocompromised condition and certain other conditions should receive both PCV13 and PPSV23 vaccines. People with human immunodeficiency virus (HIV) infection should be immunized as soon as possible after diagnosis. Immunization during chemotherapy or radiation therapy should be avoided. Routine use of PPSV23 vaccine is not recommended for American Indians, [place] Natives, or people younger than 65 years unless there are medical conditions that require PPSV23 vaccine. When indicated, people who have unknown immunization and have no record of immunization should receive PPSV23 vaccine. One-time revaccination 5 years after the first dose of PPSV23 is recommended for people aged 23 64 years who have chronic kidney failure, nephrotic syndrome, asplenia, or immunocompromised conditions. People who received 1 2 doses of PPSV23 before age 42 years should receive another dose of PPSV23 vaccine at age 75 years or later if at least 5 years have passed since the previous dose. Doses of PPSV23 are not needed for people immunized with PPSV23 at or after age 71 years.  Meningococcal vaccine. Adults with asplenia or persistent complement component deficiencies should receive 2 doses of quadrivalent meningococcal conjugate (MenACWY-D) vaccine. The doses should be obtained at least 2 months apart. Microbiologists working with certain meningococcal bacteria, [employer] recruits, people at risk during an outbreak, and  people who travel to or live in countries with a high rate of meningitis should be immunized. A first-year college student up through age 14 years  who is living in a residence hall should receive a dose if she did not receive a dose on or after her 16th birthday. Adults who have certain high-risk conditions should receive one or more doses of vaccine.  Hepatitis A vaccine. Adults who wish to be protected from this disease, have certain high-risk conditions, work with hepatitis A-infected animals, work in hepatitis A research labs, or travel to or work in countries with a high rate of hepatitis A should be immunized. Adults who were previously unvaccinated and who anticipate close contact with an international adoptee during the first 60 days after arrival in the [place] from a country with a high rate of hepatitis A should be immunized.  Hepatitis B vaccine. Adults who wish to be protected from this disease, have certain high-risk conditions, may be exposed to blood or other infectious body fluids, are household contacts or sex partners of hepatitis B positive people, are clients or workers in certain care facilities, or travel to or work in countries with a high rate of hepatitis B should be immunized.  Haemophilus influenzae type b (Hib) vaccine. A previously unvaccinated person with asplenia or sickle cell disease or having a scheduled splenectomy should receive 1 dose of Hib vaccine. Regardless of previous immunization, a recipient of a hematopoietic stem cell transplant should receive a 3-dose series 6 12 months after her successful transplant. Hib vaccine is not recommended for adults with HIV infection. Preventive Services / Frequency Ages 16 to 18  Blood pressure check.** / Every 1 to 2 years.  Lipid and cholesterol check.** / Every 5 years beginning at age 27.  Clinical breast exam.** / Every 3 years for women in their 50s and 44s.  BRCA-related cancer risk assessment.** / For women  who have family members with a BRCA-related cancer (breast, ovarian, tubal, or peritoneal cancers).  Pap test.** / Every 2 years from ages 3 through 66. Every 3 years starting at age 1 through age 52 or 32 with a history of 3 consecutive normal Pap tests.  HPV screening.** / Every 3 years from ages 60 through ages 66 to 81 with a history of 3 consecutive normal Pap tests.  Hepatitis C blood test.** / For any individual with known risks for hepatitis C.  Skin self-exam. / Monthly.  Influenza vaccine. / Every year.  Tetanus, diphtheria, and acellular pertussis (Tdap, Td) vaccine.** / Consult your caregiver. Pregnant women should receive 1 dose of Tdap vaccine during each pregnancy. 1 dose of Td every 10 years.  Varicella vaccine.** / Consult your caregiver. Pregnant females who do not have evidence of immunity should receive the first dose after pregnancy.  HPV vaccine. / 3 doses over 6 months, if 61 and younger. The vaccine is not recommended for use in pregnant females. However, pregnancy testing is not needed before receiving a dose.  Measles, mumps, rubella (MMR) vaccine.** / You need at least 1 dose of MMR if you were born in 1957 or later. You may also need a 2nd dose. For females of childbearing age, rubella immunity should be determined. If there is no evidence of immunity, females who are not pregnant should be vaccinated. If there is no evidence of immunity, females who are pregnant should delay immunization until after pregnancy.  Pneumococcal 13-valent conjugate (PCV13) vaccine.** / Consult your caregiver.  Pneumococcal polysaccharide (PPSV23) vaccine.** / 1 to 2 doses if you smoke cigarettes or if you have certain conditions.  Meningococcal vaccine.** / 1 dose if you are age 61  to 21 years and a Market researcher living in a residence hall, or have one of several medical conditions, you need to get vaccinated against meningococcal disease. You may also need additional  booster doses.  Hepatitis A vaccine.** / Consult your caregiver.  Hepatitis B vaccine.** / Consult your caregiver.  Haemophilus influenzae type b (Hib) vaccine.** / Consult your caregiver. Ages 64 to 55  Blood pressure check.** / Every 1 to 2 years.  Lipid and cholesterol check.** / Every 5 years beginning at age 58.  Lung cancer screening. / Every year if you are aged 74 80 years and have a 30-pack-year history of smoking and currently smoke or have quit within the past 15 years. Yearly screening is stopped once you have quit smoking for at least 15 years or develop a health problem that would prevent you from having lung cancer treatment.  Clinical breast exam.** / Every year after age 73.  BRCA-related cancer risk assessment.** / For women who have family members with a BRCA-related cancer (breast, ovarian, tubal, or peritoneal cancers).  Mammogram.** / Every year beginning at age 62 and continuing for as long as you are in good health. Consult with your caregiver.  Pap test.** / Every 3 years starting at age 28 through age 73 or 21 with a history of 3 consecutive normal Pap tests.  HPV screening.** / Every 3 years from ages 27 through ages 32 to 11 with a history of 3 consecutive normal Pap tests.  Fecal occult blood test (FOBT) of stool. / Every year beginning at age 62 and continuing until age 41. You may not need to do this test if you get a colonoscopy every 10 years.  Flexible sigmoidoscopy or colonoscopy.** / Every 5 years for a flexible sigmoidoscopy or every 10 years for a colonoscopy beginning at age 57 and continuing until age 28.  Hepatitis C blood test.** / For all people born from [birth year] through 1965 and any individual with known risks for hepatitis C.  Skin self-exam. / Monthly.  Influenza vaccine. / Every year.  Tetanus, diphtheria, and acellular pertussis (Tdap/Td) vaccine.** / Consult your caregiver. Pregnant women should receive 1 dose of Tdap vaccine during  each pregnancy. 1 dose of Td every 10 years.  Varicella vaccine.** / Consult your caregiver. Pregnant females who do not have evidence of immunity should receive the first dose after pregnancy.  Zoster vaccine.** / 1 dose for adults aged 31 years or older.  Measles, mumps, rubella (MMR) vaccine.** / You need at least 1 dose of MMR if you were born in 1957 or later. You may also need a 2nd dose. For females of childbearing age, rubella immunity should be determined. If there is no evidence of immunity, females who are not pregnant should be vaccinated. If there is no evidence of immunity, females who are pregnant should delay immunization until after pregnancy.  Pneumococcal 13-valent conjugate (PCV13) vaccine.** / Consult your caregiver.  Pneumococcal polysaccharide (PPSV23) vaccine.** / 1 to 2 doses if you smoke cigarettes or if you have certain conditions.  Meningococcal vaccine.** / Consult your caregiver.  Hepatitis A vaccine.** / Consult your caregiver.  Hepatitis B vaccine.** / Consult your caregiver.  Haemophilus influenzae type b (Hib) vaccine.** / Consult your caregiver. Ages 76 and over  Blood pressure check.** / Every 1 to 2 years.  Lipid and cholesterol check.** / Every 5 years beginning at age 52.  Lung cancer screening. / Every year if you are aged 61 80 years and  have a 30-pack-year history of smoking and currently smoke or have quit within the past 15 years. Yearly screening is stopped once you have quit smoking for at least 15 years or develop a health problem that would prevent you from having lung cancer treatment.  Clinical breast exam.** / Every year after age 4.  BRCA-related cancer risk assessment.** / For women who have family members with a BRCA-related cancer (breast, ovarian, tubal, or peritoneal cancers).  Mammogram.** / Every year beginning at age 43 and continuing for as long as you are in good health. Consult with your caregiver.  Pap test.** / Every  3 years starting at age 17 through age 61 or 75 with a 3 consecutive normal Pap tests. Testing can be stopped between 65 and 70 with 3 consecutive normal Pap tests and no abnormal Pap or HPV tests in the past 10 years.  HPV screening.** / Every 3 years from ages 59 through ages 96 or 31 with a history of 3 consecutive normal Pap tests. Testing can be stopped between 65 and 70 with 3 consecutive normal Pap tests and no abnormal Pap or HPV tests in the past 10 years.  Fecal occult blood test (FOBT) of stool. / Every year beginning at age 78 and continuing until age 52. You may not need to do this test if you get a colonoscopy every 10 years.  Flexible sigmoidoscopy or colonoscopy.** / Every 5 years for a flexible sigmoidoscopy or every 10 years for a colonoscopy beginning at age 18 and continuing until age 50.  Hepatitis C blood test.** / For all people born from [birth year] through 1965 and any individual with known risks for hepatitis C.  Osteoporosis screening.** / A one-time screening for women ages 74 and over and women at risk for fractures or osteoporosis.  Skin self-exam. / Monthly.  Influenza vaccine. / Every year.  Tetanus, diphtheria, and acellular pertussis (Tdap/Td) vaccine.** / 1 dose of Td every 10 years.  Varicella vaccine.** / Consult your caregiver.  Zoster vaccine.** / 1 dose for adults aged 36 years or older.  Pneumococcal 13-valent conjugate (PCV13) vaccine.** / Consult your caregiver.  Pneumococcal polysaccharide (PPSV23) vaccine.** / 1 dose for all adults aged 74 years and older.  Meningococcal vaccine.** / Consult your caregiver.  Hepatitis A vaccine.** / Consult your caregiver.  Hepatitis B vaccine.** / Consult your caregiver.  Haemophilus influenzae type b (Hib) vaccine.** / Consult your caregiver. ** Family history and personal history of risk and conditions may change your caregiver's recommendations. Document Released: 07/25/2001 Document Revised: 09/23/2012  Document Reviewed: 10/24/2010 [facility] Patient Information 2014 [place], [place].

## 2014-09-02 NOTE — Telephone Encounter (Signed)
Pt would like to get the mammogram ordered for [facility].  Please advise.

## 2014-09-02 NOTE — Progress Notes (Signed)
Subjective:     Wendy Harrell is a 39 y.o. female and is here for a comprehensive physical exam. The patient reports no problems.  Pap 2014 ASCUS, 2015 nml. Reg [facility]. Eye exam pending next mo Dental exam pending 2 mos.  History   Social History  . Marital Status: Married    Spouse Name: N/A  . Number of Children: N/A  . Years of Education: N/A   Occupational History  . Not on file.   Social History Main Topics  . Smoking status: Never Smoker   . Smokeless tobacco: Never Used  . Alcohol Use: No  . Drug Use: No  . Sexual Activity:    Partners: Male   Other Topics Concern  . Not on file   Social History Narrative   Health Maintenance  Topic Date Due  . HIV Screening  03/06/1991  . TETANUS/TDAP  03/06/1995  . INFLUENZA VACCINE  01/11/2015  . PAP SMEAR  08/29/2016    The following portions of the patient's history were reviewed and updated as appropriate: allergies, current medications, past family history, past medical history, past social history, past surgical history and problem list.  Review of Systems A comprehensive review of systems was negative.   Objective:    BP 100/67 mmHg  Pulse 96  Temp(Src) 98.3 F (36.8 C) (Oral)  Ht 5\' 2"  (1.575 m)  Wt 125 lb (56.7 kg)  BMI 22.86 kg/m2  SpO2 100%  LMP 08/12/2014 (Approximate) General appearance: alert, cooperative, appears stated age and no distress Head: Normocephalic, without obvious abnormality, atraumatic Eyes: negative findings: lids and lashes normal, conjunctivae and sclerae normal, corneas clear and pupils equal, round, reactive to light and accomodation Ears: normal TM's and external ear canals both ears Throat: lips, mucosa, and tongue normal; teeth and gums normal Neck: no adenopathy, no carotid bruit, supple, symmetrical, trachea midline and thyroid not enlarged, symmetric, no tenderness/mass/nodules Lungs: clear to auscultation bilaterally Breasts: normal appearance, no masses or tenderness,  Taught monthly breast self examination, tissue difference bilat-areas of thicker denser tisser upper outer quads, R>L. Heart: regular rate and rhythm, S1, S2 normal, no murmur, click, rub or gallop Abdomen: soft, non-tender; bowel sounds normal; no masses,  no organomegaly Pelvic: adnexae not palpable, cervix normal in appearance, external genitalia normal, no adnexal masses or tenderness, no cervical motion tenderness, perianal skin: no external genital warts noted and vagina normal without discharge Extremities: extremities normal, atraumatic, no cyanosis or edema Lymph nodes: Cervical, supraclavicular, and axillary nodes normal. Neurologic: Grossly normal    Assessment:    Healthy female exam.      Plan:     See After Visit Summary for Counseling Recommendations  Needs tdap

## 2014-09-03 ENCOUNTER — Telehealth: Payer: Self-pay | Admitting: Nurse Practitioner

## 2014-09-03 DIAGNOSIS — E559 Vitamin D deficiency, unspecified: Secondary | ICD-10-CM

## 2014-09-03 LAB — HIV ANTIBODY (ROUTINE TESTING W REFLEX): HIV 1&2 Ab, 4th Generation: NONREACTIVE

## 2014-09-03 MED ORDER — VITAMIN D3 1.25 MG (50000 UT) PO CAPS: 1.0000 | ORAL_CAPSULE | ORAL | Status: DC

## 2014-09-03 NOTE — Telephone Encounter (Signed)
Called and informed patient of results. Lab appt scheduled.

## 2014-09-03 NOTE — Telephone Encounter (Signed)
pls call pt: Advise Labs good,  But vit D too low. Start prescription. Take weekly for 12 weeks. Sched lab appt to recheck level in 12 weeks. Bad cholesterol is creeping up, but not bad-avoid cheese, limit meat to 3-4 times week, limit heavy creams. Increase fiber by eating 5 or more servings fruit & veggies & whole grains-more than 4 grams fiber per serving.

## 2014-09-07 ENCOUNTER — Other Ambulatory Visit (HOSPITAL_COMMUNITY)
Admission: RE | Admit: 2014-09-07 | Discharge: 2014-09-07 | Disposition: A | Discharge status: Home / Self Care | Payer: BLUE CROSS/BLUE SHIELD | Source: Ambulatory Visit | Attending: Nurse Practitioner | Admitting: Nurse Practitioner

## 2014-09-07 ENCOUNTER — Telehealth: Payer: Self-pay | Admitting: Family Medicine

## 2014-09-07 ENCOUNTER — Other Ambulatory Visit: Payer: Self-pay

## 2014-09-07 DIAGNOSIS — Z1151 Encounter for screening for human papillomavirus (HPV): Secondary | ICD-10-CM | POA: Diagnosis present

## 2014-09-07 DIAGNOSIS — Z01419 Encounter for gynecological examination (general) (routine) without abnormal findings: Secondary | ICD-10-CM | POA: Diagnosis not present

## 2014-09-07 DIAGNOSIS — Z0000 Encounter for general adult medical examination without abnormal findings: Secondary | ICD-10-CM

## 2014-09-07 NOTE — Telephone Encounter (Signed)
Informed lab PCP did not do recent lab. Patient was seen on 09/02/14 by Maximino SarinLayne Weaver at [facility] for preventative visit.  Stated they would call the [facility].

## 2014-09-07 NOTE — Telephone Encounter (Signed)
[name] name: [facility] Relation to pt: Call back number: [phone] Pharmacy:  Reason for call:   Hope states that there is no paperwork with patient pap specimen.

## 2014-09-09 LAB — CYTOLOGY - PAP

## 2014-09-10 ENCOUNTER — Telehealth: Payer: Self-pay | Admitting: Nurse Practitioner

## 2014-09-10 NOTE — Telephone Encounter (Signed)
pls call pt: Advise Pap neg. Next pap in 5 yrs.

## 2014-09-11 NOTE — Telephone Encounter (Signed)
Called and informed patient of lab results.  

## 2014-09-18 ENCOUNTER — Ambulatory Visit: Payer: Self-pay | Admitting: Family Medicine

## 2014-09-28 ENCOUNTER — Telehealth: Payer: Self-pay | Admitting: Nurse Practitioner

## 2014-09-28 NOTE — Telephone Encounter (Signed)
Pt. Called and asked if Layne would call her in an anxiety medication. She would prefer either Paxil or Zoloft. She said Wellbutrin has not worked in the past as it made her angry. I  Tried to schedule her an appt. But due to deductible it would cost her $165.00 for appt. but will if needs to.

## 2014-09-28 NOTE — Telephone Encounter (Signed)
Please Advise

## 2014-09-28 NOTE — Telephone Encounter (Addendum)
Please explain that psychiatric medicines carry too much liability for me to prescribe over the phone. She will need to schedule OV. DPR signed

## 2014-09-28 NOTE — Telephone Encounter (Signed)
LMOVM explaining this to patient. Informed her that she needed an appt and to CB and anyone could schedule that appt for her.

## 2014-10-06 ENCOUNTER — Telehealth: Payer: Self-pay | Admitting: Nurse Practitioner

## 2014-10-06 NOTE — Telephone Encounter (Signed)
Pt was seen a mont ago and was told to start taking Vit D. She is experiencing dizzy spells and has d/c the Vit D. She would like a call as to what to do next. It cost her 165.00 to come infor visit. Does this require further Medical Attention?

## 2014-10-06 NOTE — Telephone Encounter (Signed)
dizziness is not a reported SE of Vit D, but if it stopped when she stopped med, do not resume. If she is still dizzy although she stopped, she should consider OV.  There are many things that can cause dizziness from anxiety to allergies.  I cannot determine what is causing dizziness without examining her.  FYI: she called last month & wanted me to start her on psych meds over the phone. I refused.

## 2014-10-06 NOTE — Telephone Encounter (Signed)
Called and informed patient. She is going to give it a few more days to see if the dizziness gets better before coming in because of the 165.00 fee to be seen.

## 2014-10-06 NOTE — Telephone Encounter (Signed)
Please advise 

## 2014-10-23 ENCOUNTER — Ambulatory Visit: Payer: BLUE CROSS/BLUE SHIELD | Admitting: Nurse Practitioner

## 2014-10-23 ENCOUNTER — Other Ambulatory Visit: Payer: Self-pay

## 2014-10-23 ENCOUNTER — Telehealth: Payer: Self-pay | Admitting: Nurse Practitioner

## 2014-10-23 DIAGNOSIS — K529 Noninfective gastroenteritis and colitis, unspecified: Secondary | ICD-10-CM

## 2014-10-23 NOTE — Telephone Encounter (Signed)
Pt said thank you.

## 2014-10-23 NOTE — Telephone Encounter (Signed)
Relation to pt: self  Call back number:[phone]   Reason for call:  Pt requesting a GI referral due to her diarrhea. Pt states MD advised if diarrhea persisted referral would be placed

## 2014-10-23 NOTE — Telephone Encounter (Signed)
Called and informed patient. She is going to CB to schedule appt once she is able to get childcare.

## 2014-10-23 NOTE — Telephone Encounter (Signed)
I will not perform labs.  Dr Abner GreenspanBlyth treated her for intestinal yeast. That is not the same as diverticulitis, which is most common reason for abd pain & fever. Diverticulitis is treated w/liquid diet & antibiotics. If that doesn't help, then specialist referral is indicated for colonoscopy and/or CT scan.  She needs exam, pls sched OV.

## 2014-10-23 NOTE — Telephone Encounter (Signed)
Pt. Is requesting a referral to a GI doctor. She is experiencing pain again and running a fever.  Please remember she pays out of pocket and so she needs one that is not costly. Please call her with advice.

## 2014-10-23 NOTE — Telephone Encounter (Signed)
Order entered

## 2014-10-23 NOTE — Telephone Encounter (Signed)
Please Advise

## 2014-10-23 NOTE — Telephone Encounter (Signed)
Pain & fever may be diverticulitis, but she needs to be examined. She does not need to see GI specialist for this. Primary care will be cheaper than seeing specialist.

## 2014-10-23 NOTE — Telephone Encounter (Signed)
Called and informed patient. She has had diverticulitis for 2 years off/on. When she saw Dr. Abner GreenspanBlyth in March for this same thing she said that if all of her blood work came back good that she needed to see a GI dr. In March Cyndal spent 1000 on a lot of lab work and doesn't want to spend that again. She really just wants to go to GI.  Per patient it has been going on for about 4 yrs off/on, within 2 years it has been more on than off. And over the last month there is one day per week where she has non-stop diarrhea. Patient also wanted me to tell you that her fever is now low grade at 99.3

## 2014-10-26 ENCOUNTER — Other Ambulatory Visit (INDEPENDENT_AMBULATORY_CARE_PROVIDER_SITE_OTHER): Payer: BLUE CROSS/BLUE SHIELD

## 2014-10-26 ENCOUNTER — Ambulatory Visit (INDEPENDENT_AMBULATORY_CARE_PROVIDER_SITE_OTHER): Payer: BLUE CROSS/BLUE SHIELD | Admitting: Gastroenterology

## 2014-10-26 ENCOUNTER — Encounter: Payer: Self-pay | Admitting: Gastroenterology

## 2014-10-26 VITALS — BP 102/64 | HR 96 | Ht 62.0 in | Wt 130.0 lb

## 2014-10-26 DIAGNOSIS — R197 Diarrhea, unspecified: Secondary | ICD-10-CM | POA: Diagnosis not present

## 2014-10-26 LAB — IGA: IgA: 166 mg/dL (ref 68–378)

## 2014-10-26 NOTE — Patient Instructions (Addendum)
  Your physician has requested that you go to the basement for lab work before leaving today:  FODMAP diet handout given.  Please follow instructions on hemoccult card and return to lab when finished.  You are scheduled to come back 12/21/2014 at 2:15pm.  Thank you for choosing me and [facility].  Venita LickMalcolm T. Pleas KochStark, Jr., MD., Clementeen GrahamFACG

## 2014-10-26 NOTE — Progress Notes (Signed)
    History of Present Illness: This is a 39 year old female referred by Kelle DartingWeaver, Layne C, NP for the evaluation of chronic diarrhea. Patient relates having frequent loose urgent watery nonbloody stools for about the past 4 years. She gets occasional crampy abdominal pain and gas as well. Her symptoms have worsened over the past 1-2 years. She has tried avoiding milk products, caffeine and gluten foods without a change in symptoms. She does note that high fat foods worsen her symptoms. She had blood work and stool studies performed by Dr. Abner GreenspanBlyth they were all unremarkable except for moderate yeast noted in her stool studies. She was treated with a course of Diflucan and her diarrhea did slightly improved for several days. Diflucan helped her gas more than any other symptom. Denies weight loss, constipation, change in stool caliber, melena, hematochezia, nausea, vomiting, dysphagia, reflux symptoms, chest pain.  Review of Systems: Pertinent positive and negative review of systems were noted in the above HPI section. All other review of systems were otherwise negative.  Current Medications, Allergies, Past Medical History, Past Surgical History, Family History and Social History were reviewed in [facility] electronic medical record.  Physical Exam: General: Well developed, well nourished, no acute distress Head: Normocephalic and atraumatic Eyes:  sclerae anicteric, EOMI Ears: Normal auditory acuity Mouth: No deformity or lesions Neck: Supple, no masses or thyromegaly Lungs: Clear throughout to auscultation Heart: Regular rate and rhythm; no murmurs, rubs or bruits Abdomen: Soft, non tender and non distended. No masses, hepatosplenomegaly or hernias noted. Normal Bowel sounds Rectal: deferred Musculoskeletal: Symmetrical with no gross deformities  Skin: No lesions on visible extremities Pulses:  Normal pulses noted Extremities: No clubbing, cyanosis, edema or deformities noted Neurological:  Alert oriented x 4, grossly nonfocal Cervical Nodes:  No significant cervical adenopathy Inguinal Nodes: No significant inguinal adenopathy Psychological:  Alert and cooperative. Normal mood and affect  Assessment and Recommendations:  1. Diarrhea. Etiology unclear. Rule out IBS-D, IBD, celiac disease, pancreatic insufficiency, SIBO and other disorders. tTG, IgA, fecal elastase, stool Hemoccults. FODMAP diet. If above evaluation negative consider a 2 week empiric course of antibiotics for possible IBS-D, SIBO. Imodium tid as needed. Colonoscopy if diarrhea does not resolve. Patient would like to avoid a colonoscopy if possible due to very high deductible insurance plan. REV in 2-3 weeks.    cc: Kelle DartingLayne C Weaver, NP [id number] [place], [place] [id number]

## 2014-10-27 LAB — TISSUE TRANSGLUTAMINASE, IGA: Tissue Transglutaminase Ab, IgA: 1 U/mL (ref <4)

## 2014-11-05 ENCOUNTER — Encounter: Payer: Self-pay | Admitting: Nurse Practitioner

## 2014-11-05 DIAGNOSIS — N6310 Unspecified lump in the right breast, unspecified quadrant: Secondary | ICD-10-CM

## 2014-11-05 DIAGNOSIS — N6315 Unspecified lump in the right breast, overlapping quadrants: Secondary | ICD-10-CM | POA: Insufficient documentation

## 2014-11-11 ENCOUNTER — Ambulatory Visit: Payer: BLUE CROSS/BLUE SHIELD | Admitting: Gastroenterology

## 2014-11-23 ENCOUNTER — Encounter: Payer: Self-pay | Admitting: Nurse Practitioner

## 2014-11-27 ENCOUNTER — Ambulatory Visit: Payer: BLUE CROSS/BLUE SHIELD | Admitting: Gastroenterology

## 2014-12-04 ENCOUNTER — Other Ambulatory Visit: Payer: BLUE CROSS/BLUE SHIELD

## 2014-12-11 ENCOUNTER — Other Ambulatory Visit (INDEPENDENT_AMBULATORY_CARE_PROVIDER_SITE_OTHER): Payer: BLUE CROSS/BLUE SHIELD

## 2014-12-11 DIAGNOSIS — E559 Vitamin D deficiency, unspecified: Secondary | ICD-10-CM

## 2014-12-11 LAB — VITAMIN D 25 HYDROXY (VIT D DEFICIENCY, FRACTURES): VITD: 55.55 ng/mL (ref 30.00–100.00)

## 2014-12-15 ENCOUNTER — Telehealth: Payer: Self-pay | Admitting: Nurse Practitioner

## 2014-12-15 NOTE — Telephone Encounter (Signed)
Left detailed message on pt's cell.  OKay per DPR.  

## 2014-12-15 NOTE — Telephone Encounter (Signed)
pls call pt: Advise Vit d looks good. Take maintenance dose d3 OTC 1000 iu daily.

## 2014-12-21 ENCOUNTER — Ambulatory Visit: Payer: BLUE CROSS/BLUE SHIELD | Admitting: Gastroenterology

## 2014-12-24 ENCOUNTER — Encounter: Payer: Self-pay | Admitting: Family Medicine

## 2014-12-25 ENCOUNTER — Encounter: Payer: Self-pay | Admitting: Family Medicine

## 2015-03-15 LAB — HM MAMMOGRAPHY

## 2015-03-25 ENCOUNTER — Encounter: Payer: Self-pay | Admitting: Family Medicine

## 2015-04-06 ENCOUNTER — Encounter: Payer: Self-pay | Admitting: Family Medicine

## 2015-07-23 ENCOUNTER — Ambulatory Visit: Payer: BLUE CROSS/BLUE SHIELD | Admitting: Family Medicine

## 2015-08-19 ENCOUNTER — Ambulatory Visit: Payer: Self-pay | Admitting: Orthopedic Surgery

## 2015-08-19 NOTE — Progress Notes (Signed)
Patient coming in on 08/20/2015 for preop .  Surgery on 08/26/2015.  Need orders placed in EPIC.  Thank You.

## 2015-08-19 NOTE — Patient Instructions (Addendum)
Wendy Harrell  08/19/2015   Your procedure is scheduled on: 08/26/2015    Report to [facility] Main  Entrance take [place]  elevators to 3rd floor to  Short Stay Center at   0530 AM.  Call this number if you have problems the morning of surgery [phone]   Remember: ONLY 1 PERSON MAY GO WITH YOU TO SHORT STAY TO GET  READY MORNING OF YOUR SURGERY.  Do not eat food or drink liquids :After Midnight.     Take these medicines the morning of surgery with A SIP OF WATER: Neurontin                                You may not have any metal on your body including hair pins and              piercings  Do not wear jewelry, make-up, lotions, powders or perfumes, deodorant             Do not wear nail polish.  Do not shave  48 hours prior to surgery.                 Do not bring valuables to the hospital. [facility] IS NOT             RESPONSIBLE   FOR VALUABLES.  Contacts, dentures or bridgework may not be worn into surgery.  Leave suitcase in the car. After surgery it may be brought to your room.              Please read over the following fact sheets you were given: _____________________________________________________________________             [facility] - Preparing for Surgery Before surgery, you can play an important role.  Because skin is not sterile, your skin needs to be as free of germs as possible.  You can reduce the number of germs on your skin by washing with CHG (chlorahexidine gluconate) soap before surgery.  CHG is an antiseptic cleaner which kills germs and bonds with the skin to continue killing germs even after washing. Please DO NOT use if you have an allergy to CHG or antibacterial soaps.  If your skin becomes reddened/irritated stop using the CHG and inform your nurse when you arrive at Short Stay. Do not shave (including legs and underarms) for at least 48 hours prior to the first CHG shower.  You may shave your face/neck. Please follow these  instructions carefully:  1.  Shower with CHG Soap the night before surgery and the  morning of Surgery.  2.  If you choose to wash your hair, wash your hair first as usual with your  normal  shampoo.  3.  After you shampoo, rinse your hair and body thoroughly to remove the  shampoo.                           4.  Use CHG as you would any other liquid soap.  You can apply chg directly  to the skin and wash                       Gently with a scrungie or clean washcloth.  5.  Apply the CHG Soap to your body ONLY FROM THE  NECK DOWN.   Do not use on face/ open                           Wound or open sores. Avoid contact with eyes, ears mouth and genitals (private parts).                       Wash face,  Genitals (private parts) with your normal soap.             6.  Wash thoroughly, paying special attention to the area where your surgery  will be performed.  7.  Thoroughly rinse your body with warm water from the neck down.  8.  DO NOT shower/wash with your normal soap after using and rinsing off  the CHG Soap.                9.  Pat yourself dry with a clean towel.            10.  Wear clean pajamas.            11.  Place clean sheets on your bed the night of your first shower and do not  sleep with pets. Day of Surgery : Do not apply any lotions/deodorants the morning of surgery.  Please wear clean clothes to the hospital/surgery center.  FAILURE TO FOLLOW THESE INSTRUCTIONS MAY RESULT IN THE CANCELLATION OF YOUR SURGERY PATIENT SIGNATURE_________________________________  NURSE SIGNATURE__________________________________  ________________________________________________________________________   Rogelia Mire  An incentive spirometer is a tool that can help keep your lungs clear and active. This tool measures how well you are filling your lungs with each breath. Taking long deep breaths may help reverse or decrease the chance of developing breathing (pulmonary) problems (especially  infection) following:  A long period of time when you are unable to move or be active. BEFORE THE PROCEDURE   If the spirometer includes an indicator to show your best effort, your nurse or respiratory therapist will set it to a desired goal.  If possible, sit up straight or lean slightly forward. Try not to slouch.  Hold the incentive spirometer in an upright position. INSTRUCTIONS FOR USE   Sit on the edge of your bed if possible, or sit up as far as you can in bed or on a chair.  Hold the incentive spirometer in an upright position.  Breathe out normally.  Place the mouthpiece in your mouth and seal your lips tightly around it.  Breathe in slowly and as deeply as possible, raising the piston or the ball toward the top of the column.  Hold your breath for 3-5 seconds or for as long as possible. Allow the piston or ball to fall to the bottom of the column.  Remove the mouthpiece from your mouth and breathe out normally.  Rest for a few seconds and repeat Steps 1 through 7 at least 10 times every 1-2 hours when you are awake. Take your time and take a few normal breaths between deep breaths.  The spirometer may include an indicator to show your best effort. Use the indicator as a goal to work toward during each repetition.  After each set of 10 deep breaths, practice coughing to be sure your lungs are clear. If you have an incision (the cut made at the time of surgery), support your incision when coughing by placing a pillow or rolled up towels firmly against it. Once you are able  to get out of bed, walk around indoors and cough well. You may stop using the incentive spirometer when instructed by your caregiver.  RISKS AND COMPLICATIONS  Take your time so you do not get dizzy or light-headed.  If you are in pain, you may need to take or ask for pain medication before doing incentive spirometry. It is harder to take a deep breath if you are having pain. AFTER USE  Rest and  breathe slowly and easily.  It can be helpful to keep track of a log of your progress. Your caregiver can provide you with a simple table to help with this. If you are using the spirometer at home, follow these instructions: SEEK MEDICAL CARE IF:   You are having difficultly using the spirometer.  You have trouble using the spirometer as often as instructed.  Your pain medication is not giving enough relief while using the spirometer.  You develop fever of 100.5 F (38.1 C) or higher. SEEK IMMEDIATE MEDICAL CARE IF:   You cough up bloody sputum that had not been present before.  You develop fever of 102 F (38.9 C) or greater.  You develop worsening pain at or near the incision site. MAKE SURE YOU:   Understand these instructions.  Will watch your condition.  Will get help right away if you are not doing well or get worse. Document Released: 10/09/2006 Document Revised: 08/21/2011 Document Reviewed: 12/10/2006 Exit[employer] Patient Information 2014 [employer], [place].   ________________________________________________________________________  WHAT IS A BLOOD TRANSFUSION? Blood Transfusion Information  A transfusion is the replacement of blood or some of its parts. Blood is made up of multiple cells which provide different functions.  Red blood cells carry oxygen and are used for blood loss replacement.  White blood cells fight against infection.  Platelets control bleeding.  Plasma helps clot blood.  Other blood products are available for specialized needs, such as hemophilia or other clotting disorders. BEFORE THE TRANSFUSION  Who gives blood for transfusions?   Healthy volunteers who are fully evaluated to make sure their blood is safe. This is blood bank blood. Transfusion therapy is the safest it has ever been in the practice of medicine. Before blood is taken from a donor, a complete history is taken to make sure that person has no history of diseases nor engages in  risky social behavior (examples are intravenous drug use or sexual activity with multiple partners). The donor's travel history is screened to minimize risk of transmitting infections, such as malaria. The donated blood is tested for signs of infectious diseases, such as HIV and hepatitis. The blood is then tested to be sure it is compatible with you in order to minimize the chance of a transfusion reaction. If you or a relative donates blood, this is often done in anticipation of surgery and is not appropriate for emergency situations. It takes many days to process the donated blood. RISKS AND COMPLICATIONS Although transfusion therapy is very safe and saves many lives, the main dangers of transfusion include:   Getting an infectious disease.  Developing a transfusion reaction. This is an allergic reaction to something in the blood you were given. Every precaution is taken to prevent this. The decision to have a blood transfusion has been considered carefully by your caregiver before blood is given. Blood is not given unless the benefits outweigh the risks. AFTER THE TRANSFUSION  Right after receiving a blood transfusion, you will usually feel much better and more energetic. This is especially true  if your red blood cells have gotten low (anemic). The transfusion raises the level of the red blood cells which carry oxygen, and this usually causes an energy increase.  The nurse administering the transfusion will monitor you carefully for complications. HOME CARE INSTRUCTIONS  No special instructions are needed after a transfusion. You may find your energy is better. Speak with your caregiver about any limitations on activity for underlying diseases you may have. SEEK MEDICAL CARE IF:   Your condition is not improving after your transfusion.  You develop redness or irritation at the intravenous (IV) site. SEEK IMMEDIATE MEDICAL CARE IF:  Any of the following symptoms occur over the next 12  hours:  Shaking chills.  You have a temperature by mouth above 102 F (38.9 C), not controlled by medicine.  Chest, back, or muscle pain.  People around you feel you are not acting correctly or are confused.  Shortness of breath or difficulty breathing.  Dizziness and fainting.  You get a rash or develop hives.  You have a decrease in urine output.  Your urine turns a dark color or changes to pink, red, or brown. Any of the following symptoms occur over the next 10 days:  You have a temperature by mouth above 102 F (38.9 C), not controlled by medicine.  Shortness of breath.  Weakness after normal activity.  The white part of the eye turns yellow (jaundice).  You have a decrease in the amount of urine or are urinating less often.  Your urine turns a dark color or changes to pink, red, or brown. Document Released: 05/26/2000 Document Revised: 08/21/2011 Document Reviewed: 01/13/2008 [facility] Patient Information 2014 [place], [place].  _______________________________________________________________________

## 2015-08-20 ENCOUNTER — Ambulatory Visit (HOSPITAL_COMMUNITY)
Admission: RE | Admit: 2015-08-20 | Discharge: 2015-08-20 | Disposition: A | Discharge status: Home / Self Care | Payer: BLUE CROSS/BLUE SHIELD | Source: Ambulatory Visit | Attending: Orthopedic Surgery | Admitting: Orthopedic Surgery

## 2015-08-20 ENCOUNTER — Encounter (HOSPITAL_COMMUNITY): Payer: Self-pay

## 2015-08-20 ENCOUNTER — Encounter (HOSPITAL_COMMUNITY)
Admission: RE | Admit: 2015-08-20 | Discharge: 2015-08-20 | Disposition: A | Discharge status: Home / Self Care | Payer: BLUE CROSS/BLUE SHIELD | Source: Ambulatory Visit | Attending: Specialist | Admitting: Specialist

## 2015-08-20 ENCOUNTER — Ambulatory Visit: Payer: Self-pay | Admitting: Orthopedic Surgery

## 2015-08-20 DIAGNOSIS — M5126 Other intervertebral disc displacement, lumbar region: Secondary | ICD-10-CM | POA: Insufficient documentation

## 2015-08-20 DIAGNOSIS — M4807 Spinal stenosis, lumbosacral region: Secondary | ICD-10-CM | POA: Diagnosis not present

## 2015-08-20 HISTORY — DX: Gastro-esophageal reflux disease without esophagitis: K21.9

## 2015-08-20 HISTORY — DX: Other cervical disc displacement, unspecified cervical region: M50.20

## 2015-08-20 LAB — BASIC METABOLIC PANEL
Anion gap: 8 (ref 5–15)
BUN: 13 mg/dL (ref 6–20)
CO2: 28 mmol/L (ref 22–32)
CREATININE: 0.56 mg/dL (ref 0.44–1.00)
Calcium: 9.6 mg/dL (ref 8.9–10.3)
Chloride: 107 mmol/L (ref 101–111)
GFR calc Af Amer: >60 mL/min (ref >60)
Glucose, Bld: 93 mg/dL (ref 65–99)
Potassium: 4.4 mmol/L (ref 3.5–5.1)
Sodium: 143 mmol/L (ref 135–145)

## 2015-08-20 LAB — ABO/RH: ABO/RH(D): A NEG

## 2015-08-20 LAB — CBC
HCT: 40.6 % (ref 36.0–46.0)
Hemoglobin: 13.2 g/dL (ref 12.0–15.0)
MCH: 31.6 pg (ref 26.0–34.0)
MCHC: 32.5 g/dL (ref 30.0–36.0)
MCV: 97.1 fL (ref 78.0–100.0)
PLATELETS: 269 10*3/uL (ref 150–400)
RBC: 4.18 MIL/uL (ref 3.87–5.11)
RDW: 12.5 % (ref 11.5–15.5)
WBC: 5.1 10*3/uL (ref 4.0–10.5)

## 2015-08-20 LAB — HCG, SERUM, QUALITATIVE: PREG SERUM: NEGATIVE

## 2015-08-20 LAB — PROTIME-INR
INR: 1.05 (ref 0.00–1.49)
Prothrombin Time: 13.4 seconds (ref 11.6–15.2)

## 2015-08-20 LAB — SURGICAL PCR SCREEN
MRSA, PCR: NEGATIVE
STAPHYLOCOCCUS AUREUS: NEGATIVE

## 2015-08-20 NOTE — H&P (Signed)
Wendy Harrell is an 40 y.o. female.   Chief Complaint: back and left leg pain, weakness HPI: The patient is a 40 year old female who presents today for follow up of their back. The patient is being followed for their low back symptoms. They are now 8 week(s) out from when symptoms began. Symptoms reported today include: pain. Current treatment includes: relative rest, activity modification and pain medications. The following medication has been used for pain control: Neurontin. The patient presents today following MRI.  Wendy Harrell follows up with her husband. She has completed the Sterapred dosepak. Her pain is about the same. She still has numbness in the outer aspect of the foot, still has weakness into the leg. She walks with a stiff leg. She has taken the Neurontin. She has undergone her MRI  Past Medical History  Diagnosis Date  . Chicken pox as a child  . 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 Interstitial cystitis 03/01/2011  . Dermatitis 03/01/2011  . Cervical cancer screening 08/29/2013  . Diarrhea 08/23/2014  . Loss of weight 08/23/2014  . Raynaud's disease     Past Surgical History  Procedure Laterality Date  . Laproscopy  2001  . Cystoscopy  2001    benign    Family History  Problem Relation Age of Onset  . Depression Father   . Heart disease Father   . Hypertension Father   . Hyperlipidemia Father   . Diabetes Father     type 2  . Arthritis Father   . Anxiety disorder Father   . Alcohol abuse Maternal Grandfather   . Depression Paternal Grandmother   . Alcohol abuse Paternal Grandmother   . Alcohol abuse Paternal Grandfather   . Heart attack Paternal Grandfather   . Cancer Maternal Grandmother     skin cancer, breast cancer  . Alzheimer's disease Maternal Aunt   . Hyperlipidemia Mother   . Colon cancer Neg Hx   . Colon polyps Father   . Esophageal cancer Neg Hx   . Gallbladder disease Maternal Aunt     x4  . Gallbladder disease Maternal Uncle   . Gallbladder  disease Maternal Grandmother    Social History:  reports that she has never smoked. She has never used smokeless tobacco. She reports that she does not drink alcohol or use illicit drugs.  Allergies:  Allergies  Allergen Reactions  . Adhesive [Tape] Rash    Band-Aids gives pt a rash  . Sulfa Antibiotics Rash    Blisters in the mouth     (Not in a hospital admission)  No results found for this or any previous visit (from the past 48 hour(s)). No results found.  Review of Systems  Constitutional: Negative.   HENT: Negative.   Eyes: Negative.   Respiratory: Negative.   Cardiovascular: Negative.   Gastrointestinal: Negative.   Genitourinary: Negative.   Musculoskeletal: Positive for back pain.  Skin: Negative.   Neurological: Positive for sensory change and focal weakness.    There were no vitals taken for this visit. Physical Exam  Constitutional: She is oriented to person, place, and time. She appears well-developed and well-nourished. She appears distressed.  HENT:  Head: Normocephalic.  Eyes: Pupils are equal, round, and reactive to light.  Neck: Normal range of motion.  Cardiovascular: Normal rate.   Respiratory: Effort normal.  GI: Soft.  Musculoskeletal:  On exam, she is in the moderate-to-severe distress. Walks with an antalgic gait. Mood and affect is anxious. Seated position, straight leg raises buttock,  thigh and calf pain. Exacerbated with a dorsal augmentation maneuver. Contralateral straight leg raise is negative. She has an absent Achilles reflex on the left. Decreased sensation S1 dermatome, unable to raise up on her toes. EHL weakness is noted as well. No instability of hips, knees and ankles. Pelvis stable.  Neurological: She is alert and oriented to person, place, and time.    MRI demonstrates paracentral disc herniation. Moderate-to-large compression into the lateral recess with S1 radiculopathy. With S1 nerve root compression also extends slightly into  the foramen of 5.  X-rays, 3 view demonstrates moderate-to-severe disc degeneration at L5-S1 on her MRI. She does have disc degeneration at L3-4 and L4-5.   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  We had a exhaustive discussion concerning current pathology, relevant anatomy and treatment options. My concern that she has a persistent deficit without improvement on her dosepak and rest. We therefore discussed micro-lumbar decompression. I had an extensive discussion of the risks and benefits of the lumbar decompression with the patient including bleeding, infection, damage to neurovascular structures, epidural fibrosis, CSF leak requiring repair. We also discussed increase in pain, adjacent segment disease, recurrent disc herniation, need for future surgery including repeat decompression and/or fusion. We also discussed risks of postoperative hematoma, paralysis, anesthetic complications including DVT, PE, death, cardiopulmonary dysfunction. In addition, the perioperative and postoperative courses were discussed in detail including the rehabilitative time and return to functional activity and work. I provided the patient with an illustrated handout and utilized the appropriate surgical models. We will have her sent once to physical therapy to see if there is any position she can achieve to unload the S1 nerve root. Will get in there ASAP. Continue on the gabapentin. Decrease her activities significantly. We will start that procedure for approval for her surgery. They are hoping that she gets some improvement still fairly early two to three weeks. Certainly, if she has a profound improvement, we can always forgo surgical intervention. My concern is no significant change at all. She has tried therapy in the past which made her worse. I  would avoid stretching. Any changes in the interim, she is to call.  Plan microlumbar decompression L5-S1 left  Dorothy Spark., PA-C for Dr. Shelle Iron 08/20/2015, 9:19 AM

## 2015-08-24 ENCOUNTER — Ambulatory Visit: Payer: Self-pay | Admitting: Orthopedic Surgery

## 2015-08-24 ENCOUNTER — Other Ambulatory Visit: Payer: Self-pay | Admitting: Orthopedic Surgery

## 2015-08-26 ENCOUNTER — Ambulatory Visit (HOSPITAL_COMMUNITY): Payer: BLUE CROSS/BLUE SHIELD

## 2015-08-26 ENCOUNTER — Ambulatory Visit (HOSPITAL_COMMUNITY): Payer: BLUE CROSS/BLUE SHIELD | Admitting: Certified Registered Nurse Anesthetist

## 2015-08-26 ENCOUNTER — Other Ambulatory Visit: Payer: Self-pay | Admitting: Cardiovascular Disease

## 2015-08-26 ENCOUNTER — Ambulatory Visit (HOSPITAL_COMMUNITY)
Admission: RE | Admit: 2015-08-26 | Discharge: 2015-08-27 | Disposition: A | Discharge status: Home / Self Care | Payer: BLUE CROSS/BLUE SHIELD | Source: Ambulatory Visit | Attending: Specialist | Admitting: Specialist

## 2015-08-26 ENCOUNTER — Encounter (HOSPITAL_COMMUNITY)
Admission: RE | Disposition: A | Discharge status: Home / Self Care | Payer: Self-pay | Source: Ambulatory Visit | Attending: Specialist

## 2015-08-26 ENCOUNTER — Encounter (HOSPITAL_COMMUNITY): Payer: Self-pay | Admitting: *Deleted

## 2015-08-26 DIAGNOSIS — R42 Dizziness and giddiness: Secondary | ICD-10-CM | POA: Diagnosis not present

## 2015-08-26 DIAGNOSIS — I7300 Raynaud's syndrome without gangrene: Secondary | ICD-10-CM | POA: Insufficient documentation

## 2015-08-26 DIAGNOSIS — M4806 Spinal stenosis, lumbar region: Secondary | ICD-10-CM | POA: Diagnosis not present

## 2015-08-26 DIAGNOSIS — R000 Tachycardia, unspecified: Secondary | ICD-10-CM | POA: Diagnosis not present

## 2015-08-26 DIAGNOSIS — M5126 Other intervertebral disc displacement, lumbar region: Secondary | ICD-10-CM

## 2015-08-26 DIAGNOSIS — Z419 Encounter for procedure for purposes other than remedying health state, unspecified: Secondary | ICD-10-CM

## 2015-08-26 DIAGNOSIS — M48061 Spinal stenosis, lumbar region without neurogenic claudication: Secondary | ICD-10-CM | POA: Diagnosis present

## 2015-08-26 HISTORY — PX: LUMBAR LAMINECTOMY/DECOMPRESSION MICRODISCECTOMY: SHX5026

## 2015-08-26 LAB — CBC
HEMATOCRIT: 40.2 % (ref 36.0–46.0)
Hemoglobin: 13.3 g/dL (ref 12.0–15.0)
MCH: 31.7 pg (ref 26.0–34.0)
MCHC: 33.1 g/dL (ref 30.0–36.0)
MCV: 95.7 fL (ref 78.0–100.0)
PLATELETS: 270 10*3/uL (ref 150–400)
RBC: 4.2 MIL/uL (ref 3.87–5.11)
RDW: 12.2 % (ref 11.5–15.5)
WBC: 8.8 10*3/uL (ref 4.0–10.5)

## 2015-08-26 LAB — BASIC METABOLIC PANEL
Anion gap: 10 (ref 5–15)
BUN: 7 mg/dL (ref 6–20)
CHLORIDE: 108 mmol/L (ref 101–111)
CO2: 25 mmol/L (ref 22–32)
CREATININE: 0.67 mg/dL (ref 0.44–1.00)
Calcium: 9.4 mg/dL (ref 8.9–10.3)
GFR calc Af Amer: >60 mL/min (ref >60)
GFR calc non Af Amer: >60 mL/min (ref >60)
GLUCOSE: 237 mg/dL — AB (ref 65–99)
POTASSIUM: 3.6 mmol/L (ref 3.5–5.1)
SODIUM: 143 mmol/L (ref 135–145)

## 2015-08-26 LAB — URINALYSIS, ROUTINE W REFLEX MICROSCOPIC
Bilirubin Urine: NEGATIVE
GLUCOSE, UA: NEGATIVE mg/dL
Ketones, ur: NEGATIVE mg/dL
Leukocytes, UA: NEGATIVE
Nitrite: NEGATIVE
PH: 7.5 (ref 5.0–8.0)
Protein, ur: NEGATIVE mg/dL
SPECIFIC GRAVITY, URINE: 1.003 — AB (ref 1.005–1.030)

## 2015-08-26 LAB — TYPE AND SCREEN
ABO/RH(D): A NEG
Antibody Screen: NEGATIVE

## 2015-08-26 LAB — URINE MICROSCOPIC-ADD ON
BACTERIA UA: NONE SEEN
WBC, UA: NONE SEEN WBC/hpf (ref 0–5)

## 2015-08-26 LAB — TSH: TSH: 0.582 u[IU]/mL (ref 0.350–4.500)

## 2015-08-26 SURGERY — LUMBAR LAMINECTOMY/DECOMPRESSION MICRODISCECTOMY 1 LEVEL
Anesthesia: General | Site: Back | Laterality: Left

## 2015-08-26 MED ORDER — PHENYLEPHRINE HCL 10 MG/ML IJ SOLN
INTRAMUSCULAR | Status: DC | PRN
  Administered 2015-08-26 (×2): 80 ug via INTRAVENOUS

## 2015-08-26 MED ORDER — CEFAZOLIN SODIUM-DEXTROSE 2-3 GM-% IV SOLR
INTRAVENOUS | Status: AC
  Filled 2015-08-26: qty 50

## 2015-08-26 MED ORDER — ROCURONIUM BROMIDE 100 MG/10ML IV SOLN
INTRAVENOUS | Status: DC | PRN
  Administered 2015-08-26: 35 mg via INTRAVENOUS
  Administered 2015-08-26: 10 mg via INTRAVENOUS

## 2015-08-26 MED ORDER — HYDROCODONE-ACETAMINOPHEN 5-325 MG PO TABS
1.0000 | ORAL_TABLET | ORAL | Status: DC | PRN
  Administered 2015-08-27: 1 via ORAL
  Filled 2015-08-26: qty 1

## 2015-08-26 MED ORDER — MIDAZOLAM HCL 2 MG/2ML IJ SOLN
INTRAMUSCULAR | Status: AC
  Filled 2015-08-26: qty 2

## 2015-08-26 MED ORDER — ROCURONIUM BROMIDE 100 MG/10ML IV SOLN
INTRAVENOUS | Status: AC
  Filled 2015-08-26: qty 1

## 2015-08-26 MED ORDER — EPHEDRINE SULFATE 50 MG/ML IJ SOLN
INTRAMUSCULAR | Status: DC | PRN
  Administered 2015-08-26: 5 mg via INTRAVENOUS
  Administered 2015-08-26: 10 mg via INTRAVENOUS

## 2015-08-26 MED ORDER — ACETAMINOPHEN 325 MG PO TABS
650.0000 mg | ORAL_TABLET | ORAL | Status: DC | PRN
  Administered 2015-08-26: 650 mg via ORAL
  Filled 2015-08-26 (×2): qty 2

## 2015-08-26 MED ORDER — SUGAMMADEX SODIUM 200 MG/2ML IV SOLN
INTRAVENOUS | Status: AC
  Filled 2015-08-26: qty 2

## 2015-08-26 MED ORDER — DOCUSATE SODIUM 100 MG PO CAPS
100.0000 mg | ORAL_CAPSULE | Freq: Two times a day (BID) | ORAL | Status: DC
  Administered 2015-08-26 – 2015-08-27 (×3): 100 mg via ORAL

## 2015-08-26 MED ORDER — ESMOLOL HCL 100 MG/10ML IV SOLN
INTRAVENOUS | Status: DC | PRN
  Administered 2015-08-26: 30 mg via INTRAVENOUS

## 2015-08-26 MED ORDER — METOPROLOL TARTRATE 25 MG PO TABS: 12.5000 mg | ORAL_TABLET | Freq: Two times a day (BID) | ORAL | Status: DC

## 2015-08-26 MED ORDER — SUCCINYLCHOLINE CHLORIDE 20 MG/ML IJ SOLN
INTRAMUSCULAR | Status: DC | PRN
  Administered 2015-08-26: 100 mg via INTRAVENOUS

## 2015-08-26 MED ORDER — HYDROMORPHONE HCL 1 MG/ML IJ SOLN: 0.2500 mg | INTRAMUSCULAR | Status: DC | PRN

## 2015-08-26 MED ORDER — BUPIVACAINE-EPINEPHRINE 0.5% -1:200000 IJ SOLN
INTRAMUSCULAR | Status: DC | PRN
  Administered 2015-08-26: 13 mL

## 2015-08-26 MED ORDER — EPHEDRINE SULFATE 50 MG/ML IJ SOLN
INTRAMUSCULAR | Status: AC
  Filled 2015-08-26: qty 1

## 2015-08-26 MED ORDER — FENTANYL CITRATE (PF) 100 MCG/2ML IJ SOLN
INTRAMUSCULAR | Status: DC | PRN
  Administered 2015-08-26 (×5): 50 ug via INTRAVENOUS

## 2015-08-26 MED ORDER — ALUM & MAG HYDROXIDE-SIMETH 200-200-20 MG/5ML PO SUSP: 30.0000 mL | Freq: Four times a day (QID) | ORAL | Status: DC | PRN

## 2015-08-26 MED ORDER — SUGAMMADEX SODIUM 200 MG/2ML IV SOLN
INTRAVENOUS | Status: DC | PRN
  Administered 2015-08-26: 120 mg via INTRAVENOUS

## 2015-08-26 MED ORDER — CEFAZOLIN SODIUM-DEXTROSE 2-3 GM-% IV SOLR
2.0000 g | Freq: Three times a day (TID) | INTRAVENOUS | Status: AC
  Administered 2015-08-26 (×2): 2 g via INTRAVENOUS
  Filled 2015-08-26 (×2): qty 50

## 2015-08-26 MED ORDER — METOPROLOL TARTRATE 1 MG/ML IV SOLN
5.0000 mg | Freq: Once | INTRAVENOUS | Status: AC
  Administered 2015-08-26: 5 mg via INTRAVENOUS

## 2015-08-26 MED ORDER — LIDOCAINE HCL (CARDIAC) 20 MG/ML IV SOLN
INTRAVENOUS | Status: DC | PRN
  Administered 2015-08-26: 100 mg via INTRAVENOUS

## 2015-08-26 MED ORDER — HYDROMORPHONE HCL 1 MG/ML IJ SOLN: 0.5000 mg | INTRAMUSCULAR | Status: DC | PRN

## 2015-08-26 MED ORDER — ONDANSETRON HCL 8 MG PO TABS: 8.0000 mg | ORAL_TABLET | Freq: Three times a day (TID) | ORAL | Status: DC | PRN

## 2015-08-26 MED ORDER — ESMOLOL HCL 100 MG/10ML IV SOLN
INTRAVENOUS | Status: AC
  Filled 2015-08-26: qty 10

## 2015-08-26 MED ORDER — METOPROLOL TARTRATE 1 MG/ML IV SOLN
2.5000 mg | Freq: Four times a day (QID) | INTRAVENOUS | Status: DC | PRN
  Filled 2015-08-26: qty 5

## 2015-08-26 MED ORDER — METOPROLOL TARTRATE 1 MG/ML IV SOLN
2.5000 mg | INTRAVENOUS | Status: DC | PRN
  Filled 2015-08-26: qty 5

## 2015-08-26 MED ORDER — METHOCARBAMOL 500 MG PO TABS
500.0000 mg | ORAL_TABLET | Freq: Four times a day (QID) | ORAL | Status: DC | PRN
  Administered 2015-08-27: 500 mg via ORAL
  Filled 2015-08-26: qty 1

## 2015-08-26 MED ORDER — BISACODYL 5 MG PO TBEC: 5.0000 mg | DELAYED_RELEASE_TABLET | Freq: Every day | ORAL | Status: DC | PRN

## 2015-08-26 MED ORDER — HYDROCODONE-ACETAMINOPHEN 5-325 MG PO TABS: 1.0000 | ORAL_TABLET | ORAL | Status: DC | PRN

## 2015-08-26 MED ORDER — FENTANYL CITRATE (PF) 250 MCG/5ML IJ SOLN
INTRAMUSCULAR | Status: AC
  Filled 2015-08-26: qty 5

## 2015-08-26 MED ORDER — PROPOFOL 10 MG/ML IV BOLUS
INTRAVENOUS | Status: AC
  Filled 2015-08-26: qty 20

## 2015-08-26 MED ORDER — THROMBIN 5000 UNITS EX SOLR
OROMUCOSAL | Status: DC | PRN
  Administered 2015-08-26: 10 mL via TOPICAL

## 2015-08-26 MED ORDER — KCL IN DEXTROSE-NACL 20-5-0.45 MEQ/L-%-% IV SOLN
INTRAVENOUS | Status: AC
  Administered 2015-08-26: 14:00:00 via INTRAVENOUS
  Filled 2015-08-26 (×2): qty 1000

## 2015-08-26 MED ORDER — OXYCODONE-ACETAMINOPHEN 5-325 MG PO TABS: 1.0000 | ORAL_TABLET | ORAL | Status: DC | PRN

## 2015-08-26 MED ORDER — LACTATED RINGERS IV SOLN
INTRAVENOUS | Status: DC | PRN
  Administered 2015-08-26 (×2): via INTRAVENOUS

## 2015-08-26 MED ORDER — ONDANSETRON HCL 4 MG/2ML IJ SOLN: 4.0000 mg | INTRAMUSCULAR | Status: DC | PRN

## 2015-08-26 MED ORDER — LIDOCAINE HCL (CARDIAC) 20 MG/ML IV SOLN
INTRAVENOUS | Status: AC
Start: 2015-08-26 — End: 2015-08-26
  Filled 2015-08-26: qty 5

## 2015-08-26 MED ORDER — LACTATED RINGERS IV SOLN: INTRAVENOUS | Status: DC

## 2015-08-26 MED ORDER — METOPROLOL TARTRATE 25 MG PO TABS
25.0000 mg | ORAL_TABLET | Freq: Once | ORAL | Status: DC
  Filled 2015-08-26: qty 1

## 2015-08-26 MED ORDER — ACETAMINOPHEN 650 MG RE SUPP: 650.0000 mg | RECTAL | Status: DC | PRN

## 2015-08-26 MED ORDER — BUPIVACAINE-EPINEPHRINE (PF) 0.5% -1:200000 IJ SOLN
INTRAMUSCULAR | Status: AC
  Filled 2015-08-26: qty 30

## 2015-08-26 MED ORDER — GABAPENTIN 300 MG PO CAPS
300.0000 mg | ORAL_CAPSULE | Freq: Every day | ORAL | Status: DC
  Administered 2015-08-26: 300 mg via ORAL
  Filled 2015-08-26 (×2): qty 1

## 2015-08-26 MED ORDER — PROPOFOL 10 MG/ML IV BOLUS
INTRAVENOUS | Status: DC | PRN
  Administered 2015-08-26: 120 mg via INTRAVENOUS

## 2015-08-26 MED ORDER — SODIUM CHLORIDE 0.9 % IR SOLN
Status: AC
  Filled 2015-08-26: qty 1

## 2015-08-26 MED ORDER — DEXAMETHASONE SODIUM PHOSPHATE 10 MG/ML IJ SOLN
INTRAMUSCULAR | Status: DC | PRN
  Administered 2015-08-26: 10 mg via INTRAVENOUS

## 2015-08-26 MED ORDER — SODIUM CHLORIDE 0.9 % IJ SOLN
INTRAMUSCULAR | Status: AC
  Filled 2015-08-26: qty 10

## 2015-08-26 MED ORDER — METHOCARBAMOL 1000 MG/10ML IJ SOLN
500.0000 mg | Freq: Four times a day (QID) | INTRAVENOUS | Status: DC | PRN
  Filled 2015-08-26: qty 5

## 2015-08-26 MED ORDER — THROMBIN 5000 UNITS EX SOLR
CUTANEOUS | Status: AC
  Filled 2015-08-26: qty 10000

## 2015-08-26 MED ORDER — CEFAZOLIN SODIUM-DEXTROSE 2-3 GM-% IV SOLR
2.0000 g | INTRAVENOUS | Status: AC
  Administered 2015-08-26: 2 g via INTRAVENOUS

## 2015-08-26 MED ORDER — PHENYLEPHRINE 40 MCG/ML (10ML) SYRINGE FOR IV PUSH (FOR BLOOD PRESSURE SUPPORT)
PREFILLED_SYRINGE | INTRAVENOUS | Status: AC
  Filled 2015-08-26: qty 10

## 2015-08-26 MED ORDER — ONDANSETRON HCL 4 MG/2ML IJ SOLN
INTRAMUSCULAR | Status: DC | PRN
  Administered 2015-08-26: 4 mg via INTRAVENOUS

## 2015-08-26 MED ORDER — MIDAZOLAM HCL 5 MG/5ML IJ SOLN
INTRAMUSCULAR | Status: DC | PRN
  Administered 2015-08-26: 2 mg via INTRAVENOUS

## 2015-08-26 MED ORDER — MENTHOL 3 MG MT LOZG: 1.0000 | LOZENGE | OROMUCOSAL | Status: DC | PRN

## 2015-08-26 MED ORDER — METOPROLOL TARTRATE 12.5 MG HALF TABLET
12.5000 mg | ORAL_TABLET | Freq: Two times a day (BID) | ORAL | Status: DC
Start: 2015-08-26 — End: 2015-08-27
  Administered 2015-08-26: 12.5 mg via ORAL
  Filled 2015-08-26 (×3): qty 1

## 2015-08-26 MED ORDER — PROMETHAZINE HCL 25 MG/ML IJ SOLN: 6.2500 mg | INTRAMUSCULAR | Status: DC | PRN

## 2015-08-26 MED ORDER — ARTIFICIAL TEARS OP OINT
TOPICAL_OINTMENT | OPHTHALMIC | Status: AC
Start: 2015-08-26 — End: 2015-08-26
  Filled 2015-08-26: qty 3.5

## 2015-08-26 MED ORDER — MAGNESIUM CITRATE PO SOLN: 1.0000 | Freq: Once | ORAL | Status: DC | PRN

## 2015-08-26 MED ORDER — PHENOL 1.4 % MT LIQD: 1.0000 | OROMUCOSAL | Status: DC | PRN

## 2015-08-26 MED ORDER — SODIUM CHLORIDE 0.9 % IR SOLN
Status: DC | PRN
  Administered 2015-08-26: 500 mL

## 2015-08-26 MED ORDER — ONDANSETRON HCL 4 MG/2ML IJ SOLN
INTRAMUSCULAR | Status: AC
  Filled 2015-08-26: qty 2

## 2015-08-26 MED ORDER — RISAQUAD PO CAPS
1.0000 | ORAL_CAPSULE | Freq: Every day | ORAL | Status: DC
  Administered 2015-08-26 – 2015-08-27 (×2): 1 via ORAL
  Filled 2015-08-26 (×2): qty 1

## 2015-08-26 MED ORDER — DOCUSATE SODIUM 100 MG PO CAPS: 100.0000 mg | ORAL_CAPSULE | Freq: Two times a day (BID) | ORAL | Status: DC | PRN

## 2015-08-26 MED ORDER — DEXAMETHASONE SODIUM PHOSPHATE 10 MG/ML IJ SOLN
INTRAMUSCULAR | Status: AC
  Filled 2015-08-26: qty 1

## 2015-08-26 MED ORDER — POLYETHYLENE GLYCOL 3350 17 G PO PACK: 17.0000 g | PACK | Freq: Every day | ORAL | Status: DC | PRN

## 2015-08-26 SURGICAL SUPPLY — 50 items
CLEANER TIP ELECTROSURG 2X2 (MISCELLANEOUS) ×2 IMPLANT
CLOTH 2% CHLOROHEXIDINE 3PK (PERSONAL CARE ITEMS) ×2 IMPLANT
DRAPE MICROSCOPE LEICA (MISCELLANEOUS) ×2 IMPLANT
DRAPE POUCH INSTRU U-SHP 10X18 (DRAPES) ×2 IMPLANT
DRAPE SHEET LG 3/4 BI-LAMINATE (DRAPES) ×2 IMPLANT
DRAPE SURG 17X11 SM STRL (DRAPES) ×2 IMPLANT
DRAPE UTILITY XL STRL (DRAPES) ×2 IMPLANT
DRSG AQUACEL AG ADV 3.5X 4 (GAUZE/BANDAGES/DRESSINGS) ×1 IMPLANT
DRSG AQUACEL AG ADV 3.5X 6 (GAUZE/BANDAGES/DRESSINGS) IMPLANT
DURAPREP 26ML APPLICATOR (WOUND CARE) ×2 IMPLANT
ELECT BLADE TIP CTD 4 INCH (ELECTRODE) ×2 IMPLANT
ELECT REM PT RETURN 9FT ADLT (ELECTROSURGICAL) ×2
ELECTRODE REM PT RTRN 9FT ADLT (ELECTROSURGICAL) ×1 IMPLANT
GLOVE BIOGEL PI IND STRL 6.5 (GLOVE) IMPLANT
GLOVE BIOGEL PI IND STRL 7.0 (GLOVE) ×1 IMPLANT
GLOVE BIOGEL PI IND STRL 7.5 (GLOVE) IMPLANT
GLOVE BIOGEL PI INDICATOR 6.5 (GLOVE) ×1
GLOVE BIOGEL PI INDICATOR 7.0 (GLOVE) ×1
GLOVE BIOGEL PI INDICATOR 7.5 (GLOVE) ×2
GLOVE SURG SS PI 6.5 STRL IVOR (GLOVE) ×2 IMPLANT
GLOVE SURG SS PI 7.0 STRL IVOR (GLOVE) ×2 IMPLANT
GLOVE SURG SS PI 7.5 STRL IVOR (GLOVE) ×4 IMPLANT
GLOVE SURG SS PI 8.0 STRL IVOR (GLOVE) ×4 IMPLANT
GOWN STRL NON-REIN LRG LVL3 (GOWN DISPOSABLE) ×4 IMPLANT
GOWN STRL REUS W/TWL XL LVL3 (GOWN DISPOSABLE) ×5 IMPLANT
IV CATH 14GX2 1/4 (CATHETERS) ×2 IMPLANT
KIT BASIN OR (CUSTOM PROCEDURE TRAY) ×2 IMPLANT
KIT POSITIONING SURG ANDREWS (MISCELLANEOUS) ×2 IMPLANT
MANIFOLD NEPTUNE II (INSTRUMENTS) ×2 IMPLANT
NDL SPNL 18GX3.5 QUINCKE PK (NEEDLE) ×2 IMPLANT
NEEDLE SPNL 18GX3.5 QUINCKE PK (NEEDLE) ×4 IMPLANT
PACK LAMINECTOMY ORTHO (CUSTOM PROCEDURE TRAY) ×2 IMPLANT
PATTIES SURGICAL .5 X.5 (GAUZE/BANDAGES/DRESSINGS) ×2 IMPLANT
PATTIES SURGICAL .75X.75 (GAUZE/BANDAGES/DRESSINGS) ×1 IMPLANT
RUBBERBAND STERILE (MISCELLANEOUS) ×4 IMPLANT
SPONGE SURGIFOAM ABS GEL 100 (HEMOSTASIS) ×2 IMPLANT
STAPLER VISISTAT (STAPLE) IMPLANT
STRIP CLOSURE SKIN 1/2X4 (GAUZE/BANDAGES/DRESSINGS) ×2 IMPLANT
SUT NURALON 4 0 TR CR/8 (SUTURE) IMPLANT
SUT PROLENE 3 0 PS 2 (SUTURE) ×2 IMPLANT
SUT VIC AB 1 CT1 27 (SUTURE)
SUT VIC AB 1 CT1 27XBRD ANTBC (SUTURE) IMPLANT
SUT VIC AB 1-0 CT2 27 (SUTURE) ×2 IMPLANT
SUT VIC AB 2-0 CT1 27 (SUTURE)
SUT VIC AB 2-0 CT1 TAPERPNT 27 (SUTURE) IMPLANT
SUT VIC AB 2-0 CT2 27 (SUTURE) ×2 IMPLANT
SYR 3ML LL SCALE MARK (SYRINGE) IMPLANT
TOWEL OR 17X26 10 PK STRL BLUE (TOWEL DISPOSABLE) ×2 IMPLANT
TOWEL OR NON WOVEN STRL DISP B (DISPOSABLE) ×1 IMPLANT
YANKAUER SUCT BULB TIP NO VENT (SUCTIONS) IMPLANT

## 2015-08-26 NOTE — Discharge Instructions (Signed)

## 2015-08-26 NOTE — Progress Notes (Signed)
Patient complaining of burning when urinating. She has a hx of interstitial cystitis. UA sent.

## 2015-08-26 NOTE — Anesthesia Preprocedure Evaluation (Signed)
Anesthesia Evaluation  Patient identified by MRN, date of birth, ID band Patient awake    Reviewed: Allergy & Precautions, NPO status , Patient's Chart, lab work & pertinent test results  Airway Mallampati: II  TM Distance: >3 FB Neck ROM: Full    Dental no notable dental hx.    Pulmonary neg pulmonary ROS,    Pulmonary exam normal breath sounds clear to auscultation       Cardiovascular negative cardio ROS Normal cardiovascular exam Rhythm:Regular Rate:Normal     Neuro/Psych negative neurological ROS  negative psych ROS   GI/Hepatic negative GI ROS, Neg liver ROS,   Endo/Other  negative endocrine ROS  Renal/GU negative Renal ROS  negative genitourinary   Musculoskeletal negative musculoskeletal ROS (+)   Abdominal   Peds negative pediatric ROS (+)  Hematology negative hematology ROS (+)   Anesthesia Other Findings   Reproductive/Obstetrics negative OB ROS                            Anesthesia Physical Anesthesia Plan  ASA: II  Anesthesia Plan: General   Post-op Pain Management:    Induction: Intravenous  Airway Management Planned: Oral ETT  Additional Equipment:   Intra-op Plan:   Post-operative Plan: Extubation in OR  Informed Consent: I have reviewed the patients History and Physical, chart, labs and discussed the procedure including the risks, benefits and alternatives for the proposed anesthesia with the patient or authorized representative who has indicated his/her understanding and acceptance.   Dental advisory given  Plan Discussed with: CRNA and Surgeon  Anesthesia Plan Comments:         Anesthesia Quick Evaluation  

## 2015-08-26 NOTE — Anesthesia Procedure Notes (Signed)
Procedure Name: Intubation Date/Time: 08/26/2015 7:42 AM Performed by: Orest DikesPETERS, Frayda Egley J Pre-anesthesia Checklist: Patient identified, Emergency Drugs available, Suction available and Patient being monitored Patient Re-evaluated:Patient Re-evaluated prior to inductionOxygen Delivery Method: Circle System Utilized Preoxygenation: Pre-oxygenation with 100% oxygen Intubation Type: IV induction Ventilation: Mask ventilation without difficulty Laryngoscope Size: Mac and 4 Grade View: Grade I Tube type: Oral Tube size: 7.5 mm Number of attempts: 1 Airway Equipment and Method: Stylet Placement Confirmation: ETT inserted through vocal cords under direct vision,  positive ETCO2 and breath sounds checked- equal and bilateral Secured at: 21 cm Tube secured with: Tape Dental Injury: Teeth and Oropharynx as per pre-operative assessment

## 2015-08-26 NOTE — Consult Note (Signed)
Cardiologist:  New Reason for Consult: Tachycardia Referring Physician:   Dawnya Grams is an 40 y.o. female.  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  She had extensive work up for it and tried a beta blocker which made her feel terrible.  She underwent lumbar decompression of L5-S1.  She is currently not in pain and can feel her heart beating fast.  The patient currently denies nausea, vomiting, fever, chest pain, shortness of breath, orthopnea, PND, cough, congestion, abdominal pain, hematochezia, melena, lower extremity edema.  She's had a couple episodes of dizziness in January and February which lasted an hour or so.      Past Medical History  Diagnosis Date  . Chicken pox as a child  . 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 Interstitial cystitis 03/01/2011  . Dermatitis 03/01/2011  . Cervical cancer screening 08/29/2013  . Diarrhea 08/23/2014  . Loss of weight 08/23/2014  . Raynaud's disease   . GERD (gastroesophageal reflux disease)   . Slipped cervical disc   . Tachycardia     Since her 20's.  Resting HR of 100-110 is not unusual    Past Surgical History  Procedure Laterality Date  . Laproscopy  2001  . Cystoscopy  2001    benign    Family History  Problem Relation Age of Onset  . Depression Father   . Heart disease Father   . Hypertension Father   . Hyperlipidemia Father   . Diabetes Father     type 2  . Arthritis Father   . Anxiety disorder Father   . Alcohol abuse Maternal Grandfather   . Depression Paternal Grandmother   . Alcohol abuse Paternal Grandmother   . Alcohol abuse Paternal Grandfather   . Heart attack Paternal Grandfather   . Cancer Maternal Grandmother     skin cancer, breast cancer  . Alzheimer's disease Maternal Aunt   . Hyperlipidemia Mother   . Colon cancer Neg Hx   . Colon polyps Father   . Esophageal cancer Neg Hx   . Gallbladder disease Maternal Aunt     x4  . Gallbladder disease  Maternal Uncle   . Gallbladder disease Maternal Grandmother     Social History:  reports that she has never smoked. She has never used smokeless tobacco. She reports that she does not drink alcohol or use illicit drugs.  Allergies:  Allergies  Allergen Reactions  . Adhesive [Tape] Rash    Band-Aids gives pt a rash  . Sulfa Antibiotics Rash    Blisters in the mouth    Medications:  Scheduled Meds: . acidophilus  1 capsule Oral Daily  .  ceFAZolin (ANCEF) IV  2 g Intravenous Q8H  . docusate sodium  100 mg Oral BID  . gabapentin  300 mg Oral Daily  . metoprolol tartrate  25 mg Oral Once   Continuous Infusions: . dextrose 5 % and 0.45 % NaCl with KCl 20 mEq/L 50 mL/hr at 08/26/15 1355   PRN Meds:.acetaminophen **OR** acetaminophen, alum & mag hydroxide-simeth, bisacodyl, HYDROcodone-acetaminophen, HYDROmorphone (DILAUDID) injection, magnesium citrate, menthol-cetylpyridinium **OR** phenol, methocarbamol **OR** methocarbamol (ROBAXIN)  IV, metoprolol, ondansetron (ZOFRAN) IV, oxyCODONE-acetaminophen, polyethylene glycol   No results found for this or any previous visit (from the past 48 hour(s)).  Dg Spine Portable 1 View  08/26/2015  CLINICAL DATA:  Lumbar decompression EXAM: PORTABLE SPINE - 1 VIEW COMPARISON:  Study obtained earlier in the day FINDINGS: Cross-table  lateral image is labeled #3. Metallic probe tip overlies the L5-S1 interspace from a posterior approach. No fracture or spondylolisthesis. There is moderate disc space narrowing at L4-5 and L5-S1. IMPRESSION: Metallic probe tip overlies the L5-S1 interspace from a posterior approach. There is disc space narrowing at L4-5 and L5-S1. No fracture or spondylolisthesis. Electronically Signed   By: Bretta Bang III M.D.   On: 08/26/2015 09:19   Dg Spine Portable 1 View  08/26/2015  CLINICAL DATA:  Intraoperative localization for L5-S1 decompression EXAM: PORTABLE SPINE - 1 VIEW COMPARISON:  08/20/2015, 08/26/2015 FINDINGS:  Surgical retractors now seen posterior to the S1 level with surgical instruments at the L5-S1 posterior elements. The numbering nomenclature is similar to that utilized on prior plain film examination. IMPRESSION: Intraoperative localization at L5-S1. Electronically Signed   By: Alcide Clever M.D.   On: 08/26/2015 08:23   Dg Spine Portable 1 View  08/26/2015  CLINICAL DATA:  intraoperative film #1, please number spinous process for surgeon, L5-S1 lumbar decompression l EXAM: PORTABLE SPINE - 1 VIEW COMPARISON:  08/20/2015 FINDINGS: FILM #1: A surgical instrument is identified posterior to the vertebral body of S1, just below the L5 spinous process. IMPRESSION: Intraoperative localization. Electronically Signed   By: Norva Pavlov M.D.   On: 08/26/2015 08:23    Review of Systems  Constitutional: Negative for fever and diaphoresis.  HENT: Negative for sore throat.   Respiratory: Negative for cough and shortness of breath.   Cardiovascular: Positive for palpitations. Negative for chest pain, orthopnea, leg swelling and PND.  Gastrointestinal: Negative for nausea, vomiting, abdominal pain, blood in stool and melena.  Musculoskeletal: Negative for myalgias.  Neurological: Positive for dizziness.  Psychiatric/Behavioral: Negative for depression.  All other systems reviewed and are negative.  Blood pressure 142/79, pulse 136, temperature 98.4 F (36.9 C), temperature source Axillary, resp. rate 105, height  (1.549 m), weight 125 lb 9.6 oz (56.972 kg), SpO2 100 %. Physical Exam  Nursing note and vitals reviewed.  Well nourished, well developed, in no acute distress HEENT: Pupils are equal round react to light accommodation extraocular movements are intact.  Neck: no JVDNo cervical lymphadenopathy. Cardiac: Regular rhythm.  Rate fast without murmurs rubs or gallops. Lungs:  clear to auscultation bilaterally, no wheezing, rhonchi or rales Abd: soft, nontender, positive bowel sounds all  quadrants, no hepatosplenomegaly Ext: no lower extremity edema.  2+ radial and dorsalis pedis pulses. Skin: warm and dry Neuro:  Grossly normal   Assessment/Plan: Principal Problem:   HNP (herniated nucleus pulposus), lumbar Active Problems:   Spinal stenosis of lumbar region   Sinus tachycardia (HCC)  Previous history of Sinus tach and tried a BB at age 26 which made her feel terrible.  She is fairly steady at 150-160BPM right now.  May be related to meds given during surgery.  Anxiety although she reports feeling good.  No pain.  I will give her 2.5mg  IV lopressor PRN then the PO dose.  TSH pending.  Wilburt Finlay, PAC 08/26/2015, 3:10 PM     Attending Note:   The patient was seen and examined.  Agree with assessment and plan as noted above.  Changes made to the above note as needed.  1. Sinus tachycardia: Has had sinus tach for years.   Has tried Toprol XL but did not tolerate it Was very tachycardic following back surgery today  I gave her metoprolol 5 mg IV  Responded very well to Metoprolol 5 mg iv Will restart Metoprolol 12. 5  bid She can see me in the office in 4-6 weeks I've asked her to measure her BP and HR daily   If she has questions, she may call my nurse , Marcelino DusterMichelle.   Vesta MixerPhilip J. Nahser, Montez HagemanJr., MD, [facility] 08/26/2015, 3:41 PM [address],  [address] Office [phone]- [phone] Pager [phone]- [phone]

## 2015-08-26 NOTE — Transfer of Care (Signed)
Immediate Anesthesia Transfer of Care Note  Patient: Wendy Harrell  Procedure(s) Performed: Procedure(s): MICRO LUMBAR DECOMPRESSION  L5 - S1 ON LEFT (Left)  Patient Location: PACU  Anesthesia Type:General  Level of Consciousness:  sedated, patient cooperative and responds to stimulation  Airway & Oxygen Therapy:Patient Spontanous Breathing and Patient connected to face mask oxgen  Post-op Assessment:  Report given to PACU RN and Post -op Vital signs reviewed and stable  Post vital signs:  Reviewed and stable  Last Vitals:  Filed Vitals:   08/26/15 0543 08/26/15 0941  BP: 100/56   Pulse: 99 112  Temp: 36.7 C 36.9 C  Resp: 16 8    Complications: No apparent anesthesia complications

## 2015-08-26 NOTE — H&P (View-Only) (Signed)
Patient coming in on 08/20/2015 for preop .  Surgery on 08/26/2015.  Need orders placed in EPIC.  Thank You.  

## 2015-08-26 NOTE — Op Note (Signed)
NAME:  Wendy Harrell, Wendy Harrell             ACCOUNT NO.:  [id number]  MEDICAL RECORD NO.:  [id number]  LOCATION:  [facility]                         [facility]  PHYSICIAN:  Jene EveryJeffrey Latrelle Fuston, M.D.    DATE OF BIRTH:  September 09, 1975  DATE OF PROCEDURE:  08/26/2015 DATE OF DISCHARGE:                              OPERATIVE REPORT   PREOPERATIVE DIAGNOSES:  Spinal stenosis, herniated nucleus pulposus at L5-S1, left.  POSTOPERATIVE DIAGNOSES:  Spinal stenosis, herniated nucleus pulposus at L5-S1, left.  PROCEDURES PERFORMED: 1. Microlumbar decompression of L5-S1, left. 2. Foraminotomies of L5-S1, left. 3. Microdiskectomy of L5-S1, left.  ANESTHESIA:  General.  ASSISTANT:  Lanna PocheJacqueline Bissell, PA.  HISTORY:  A 40 year old female with myotomal weakness, dermatomal dysesthesias, neural tension signs secondary to large extruded fragment, 5-1 compressing the S1 nerve root.  She had anesthetic in the S1 nerve root distribution, weakness in the EHL and plantar flexion of neural tension signs refractory to conservative treatment including steroids and attempt to physical therapy, which aggravated her symptoms.  She was indicated for microlumbar decompression with extruded fragment at 5-1 compressing the S1 nerve root.  Risks and benefits were discussed including bleeding, infection, damage to the neurovascular structures, DVT, PE, anesthetic complications, no change in symptoms, worsening symptoms, need for fusion in the future, recurrent disk herniation, etc. She did have disk degeneration at 5-1, but no back pain.  TECHNIQUE:  With the patient in supine position, after induction of adequate general anesthesia, 2 g of Kefzol, placed prone on the MelfaAndrews frame.  All bony prominences were well padded.  Lumbar region was prepped and draped in usual sterile fashion.  One 18-gauge spinal needle was utilized to localize the 5-1 interspace confirmed with x-ray. Incision was made from spinous process of 5-1.   Subcutaneous tissue was dissected.  Electrocautery was utilized to achieve hemostasis. Dorsolumbar fascia was identified, divided on the skin incision, paraspinous muscle was elevated from lamina of 5-1.  Operating microscope was draped and brought on the surgical field.  Again, we divided the paraspinous musculature as opposed to detaching them. McCullough retractor was placed, operating microscope was draped and brought on the surgical field.  Confirmatory radiograph was obtained.  A microcurette was utilized to detach the ligamentum flavum from the cephalad edge of S1.  Penfield 4 was then placed beneath the ligamentum flavum protecting the neural element of S1, which was severely compressed into the lateral recess.  I used a 2-mm Kerrison to perform a generous foraminotomy of S1.  Again, the S1 nerve root was compressed laterally.  I then again protecting the S1 nerve root, decompressed the lateral recess to the medial border of the pedicle with 2-mm Kerrison. There was severe compression in the lateral recess.  Subsequently performed the hemilaminotomy on the caudad edge of 5 with protecting the neural elements and neuro patties, removed the ligamentum flavum.  Then, gently mobilized the S1 nerve root medially with D'Errico, extruded fragment was noted cephalad.  This was then mobilized with combination of a blunt-tipped nerve hook, a micropituitary.  A large single extruded fragment was excised, was removed, a second extruded fragment was removed as well.  I then performed an annulotomy of the disk space.  Portion of the disk material was removed from the disk space with micro and a straight pituitary, further mobilized with an Epstein preserving the endplates.  We then used catheter irrigation within the disk space mobilizing few additional fragments, which were retrieved.  We then used a Woodson then to probe the foramen of 5 and S1, no disk herniation, some stenosis noted on the 5  foramen and foraminotomy performed protecting the 5 root.  No residual compression of the root in the foramen, the axilla, the shoulder of the root or beneath the thecal sac. Bipolar electrocautery had been utilized to achieve hemostasis and to mobilize tethering epidural venous plexus.  We then had free mobilization of the S1 nerve root at least a cm to the medial pedicle without tension.  Copiously irrigated the wound.  Confirmatory radiograph obtained.  We used thrombin-soaked Gelfoam temporarily for hemostasis.  A neuroprobe passed freely at the foramen of 5 and S1.  The nerve root was found to be erythematous and edematous, but intact.  No evidence of CSF leakage or active bleeding.  Therefore, removed the [facility] retractor.  Paraspinous muscles were irrigated.  Epidural fat had been draped over the S1 nerve root.  Closed the dorsolumbar fascia with 1 Vicryl, subcu with 2-0, and skin with subcuticular Prolene. Sterile dressing applied.  Placed supine on the hospital bed, extubated without difficulty, and transported to the recovery room in satisfactory condition.  The patient tolerated the procedure well.  No complications.  Assistant, Lanna Poche, [place].  Minimal blood loss.     Jene Every, M.D.     Cordelia Pen  D:  08/26/2015  T:  08/26/2015  Job:  [id number]

## 2015-08-26 NOTE — Anesthesia Postprocedure Evaluation (Signed)
Anesthesia Post Note  Patient: Wendy Harrell  Procedure(s) Performed: Procedure(s) (LRB): MICRO LUMBAR DECOMPRESSION  L5 - S1 ON LEFT (Left)  Patient location during evaluation: PACU Anesthesia Type: General Level of consciousness: awake and alert Pain management: pain level controlled Vital Signs Assessment: post-procedure vital signs reviewed and stable Respiratory status: spontaneous breathing, nonlabored ventilation, respiratory function stable and patient connected to nasal cannula oxygen Cardiovascular status: blood pressure returned to baseline and stable Postop Assessment: no signs of nausea or vomiting Anesthetic complications: no    Last Vitals:  Filed Vitals:   08/26/15 1336 08/26/15 1859  BP: 142/79 130/80  Pulse: 136 104  Temp: 36.9 C 36.8 C  Resp: 15 18    Last Pain:  Filed Vitals:   08/26/15 1900  PainSc: 0-No pain                 Nathasha Fiorillo S

## 2015-08-26 NOTE — Brief Op Note (Signed)
08/26/2015  9:18 AM  PATIENT:  Wendy Harrell  40 y.o. female  PRE-OPERATIVE DIAGNOSIS:  HNP L5 - S1  POST-OPERATIVE DIAGNOSIS:  HNP L5 - S1  PROCEDURE:  Procedure(s): MICRO LUMBAR DECOMPRESSION  L5 - S1 ON LEFT (Left)  SURGEON:  Surgeon(s) and Role:    * Jene EveryJeffrey Kikuye Korenek, MD - Primary  PHYSICIAN ASSISTANT:   ASSISTANTS: Bissell   ANESTHESIA:   general  EBL:  Total I/O In: 1000 [I.V.:1000] Out: -   BLOOD ADMINISTERED:none  DRAINS: none   LOCAL MEDICATIONS USED:  MARCAINE     SPECIMEN:  Source of Specimen:  L5S1  DISPOSITION OF SPECIMEN:  PATHOLOGY  COUNTS:  YES  TOURNIQUET:  * No tourniquets in log *  DICTATION: .Other Dictation: Dictation Number  [id number]  PLAN OF CARE: Admit for overnight observation  PATIENT DISPOSITION:  PACU - hemodynamically stable.   Delay start of Pharmacological VTE agent (>24hrs) due to surgical blood loss or risk of bleeding: yes

## 2015-08-26 NOTE — Interval H&P Note (Signed)
History and Physical Interval Note:  08/26/2015 7:27 AM  [name]  has presented today for surgery, with the diagnosis of HNP L5 - S1  The various methods of treatment have been discussed with the patient and family. After consideration of risks, benefits and other options for treatment, the patient has consented to  Procedure(s): MICRO LUMBAR DECOMPRESSION  L5 - S1 ON LEFT (Left) as a surgical intervention .  The patient's history has been reviewed, patient examined, no change in status, stable for surgery.  I have reviewed the patient's chart and labs.  Questions were answered to the patient's satisfaction.     Bertin Inabinet C

## 2015-08-27 DIAGNOSIS — R000 Tachycardia, unspecified: Secondary | ICD-10-CM | POA: Diagnosis not present

## 2015-08-27 DIAGNOSIS — M5126 Other intervertebral disc displacement, lumbar region: Secondary | ICD-10-CM | POA: Diagnosis not present

## 2015-08-27 NOTE — Addendum Note (Signed)
Addendum  created 08/27/15 [id number] by Elyn PeersSandra J Davidjames Blansett, CRNA   Modules edited: Charges VN

## 2015-08-27 NOTE — Progress Notes (Signed)
    Subjective: Some back pain.  Some dizziness at 0300hrs after taking robaxin on top of lopressor  Objective: Vital signs in last 24 hours: Temp:  [97.8 F (36.6 C)-98.7 F (37.1 C)] 98.5 F (36.9 C) (03/17 0509) Pulse Rate:  [95-138] 95 (03/17 0509) Resp:  [8-21] 16 (03/17 0509) BP: (92-144)/(60-96) 92/60 mmHg (03/17 0509) SpO2:  [97 %-100 %] 97 % (03/17 0509) Last BM Date: 08/25/15  Intake/Output from previous day: 03/16 0701 - 03/17 0700 In: 3130 [P.O.:1080; I.V.:2000; IV Piggyback:50] Out: 1610 [Urine:1600; Blood:10] Intake/Output this shift:    Medications Scheduled Meds: . acidophilus  1 capsule Oral Daily  . docusate sodium  100 mg Oral BID  . gabapentin  300 mg Oral Daily  . metoprolol tartrate  12.5 mg Oral BID   Continuous Infusions: . dextrose 5 % and 0.45 % NaCl with KCl 20 mEq/L 50 mL/hr at 08/26/15 1355   PRN Meds:.acetaminophen **OR** acetaminophen, alum & mag hydroxide-simeth, bisacodyl, HYDROcodone-acetaminophen, HYDROmorphone (DILAUDID) injection, magnesium citrate, menthol-cetylpyridinium **OR** phenol, methocarbamol **OR** methocarbamol (ROBAXIN)  IV, metoprolol, ondansetron (ZOFRAN) IV, oxyCODONE-acetaminophen, polyethylene glycol  PE: General appearance: alert, cooperative and no distress Lungs: clear to auscultation bilaterally Heart: regular rate and rhythm, S1, S2 normal, no murmur, click, rub or gallop Extremities: No LEE Pulses: 2+ and symmetric Skin: warm and dry Neurologic: Grossly normal  Lab Results:   Recent Labs  08/26/15 1505  WBC 8.8  HGB 13.3  HCT 40.2  PLT 270   BMET  Recent Labs  08/26/15 1505  NA 143  K 3.6  CL 108  CO2 25  GLUCOSE 237*  BUN 7  CREATININE 0.67  CALCIUM 9.4    Assessment/Plan  Principal Problem:   HNP (herniated nucleus pulposus), lumbar Active Problems:   Spinal stenosis of lumbar region   Sinus tachycardia (HCC)  HR improved. Some mild hypotension and dizziness at 030hrs.  None  since.  She can hold the lopressor when taking pain meds to avoid feeling dizzy.  As pain resolves, without pain meds, try and maintain the lopressor at BID dosing.  FU with Dr. Elease HashimotoNahser in 4-6 weeks      Wilburt FinlayHAGER, BRYAN PA-C 08/27/2015 7:59 AM  Attending Note:   The patient was seen and examined.  Agree with assessment and plan as noted above.  Changes made to the above note as needed.  Doing well on low dose metoprolol  Will see her in several weeks   Gets a bit dizzy on the robaxin    Alvia GrovePhilip J. Nahser, Jr., MD, [facility] 08/27/2015, 9:58 AM [address],  [address] Office [phone] Pager [phone]

## 2015-08-27 NOTE — Care Management Note (Signed)
Case Management Note  Patient Details  Name: Wendy Harrell MRN: [id number] Date of Birth: 11/04/1975  Subjective/Objective:   s/p MICRO LUMBAR DECOMPRESSION L5 - S1                  Action/Plan: Discharge Planning: AVS reviewed NCM spoke to pt at bedside. No NCM needs identified. States she can afford her medications at home.   Wendy Harrell  Expected Discharge Date:  08/27/15               Expected Discharge Plan:  Home/Self Care  In-House Referral:  NA  Discharge planning Services  CM Consult  Post Acute Care Choice:  NA Choice offered to:  NA  DME Arranged:  N/A DME Agency:  NA  HH Arranged:  NA HH Agency:  NA  Status of Service:  Completed, signed off  Medicare Important Message Given:    Date Medicare IM Given:    Medicare IM give by:    Date Additional Medicare IM Given:    Additional Medicare Important Message give by:     If discussed at Long Length of Stay Meetings, dates discussed:    Additional Comments:  Wendy CousinShavis, [name], RN 08/27/2015, 12:17 PM

## 2015-08-27 NOTE — Progress Notes (Addendum)
Subjective: 1 Day Post-Op Procedure(s) (LRB): MICRO LUMBAR DECOMPRESSION  L5 - S1 ON LEFT (Left) Patient reports pain as mild.  Reports some mild incisional pain. Took Robaxin 3am with lightheadedness following her lopressor. She had a similar issue with Robaxin in the past. Has not taken anything else for pain but is c/o incisional back pain this AM. No leg pain. Has been OOB to bathroom tolerated well. Would like to go home today.  Objective: Vital signs in last 24 hours: Temp:  [97.8 F (36.6 C)-98.7 F (37.1 C)] 98.5 F (36.9 C) (03/17 0509) Pulse Rate:  [95-138] 95 (03/17 0509) Resp:  [8-21] 16 (03/17 0509) BP: (92-144)/(60-96) 92/60 mmHg (03/17 0509) SpO2:  [97 %-100 %] 97 % (03/17 0509)  Intake/Output from previous day: 03/16 0701 - 03/17 0700 In: 3130 [P.O.:1080; I.V.:2000; IV Piggyback:50] Out: 1610 [Urine:1600; Blood:10] Intake/Output this shift:     Recent Labs  08/26/15 1505  HGB 13.3    Recent Labs  08/26/15 1505  WBC 8.8  RBC 4.20  HCT 40.2  PLT 270    Recent Labs  08/26/15 1505  NA 143  K 3.6  CL 108  CO2 25  BUN 7  CREATININE 0.67  GLUCOSE 237*  CALCIUM 9.4   No results for input(s): LABPT, INR in the last 72 hours.  Neurologically intact ABD soft Neurovascular intact Sensation intact distally Intact pulses distally Dorsiflexion/Plantar flexion intact Incision: dressing C/D/I and no drainage No cellulitis present Compartment soft no sign of DVT  Assessment/Plan: 1 Day Post-Op Procedure(s) (LRB): MICRO LUMBAR DECOMPRESSION  L5 - S1 ON LEFT (Left) Advance diet Up with therapy D/C IV fluids  D/C Robaxin Discussed with cardiology PA- lopressor prn if HR >100 at rest Pain meds prn- can hold lopressor if taking pain meds and HR well controlled Plan for D/C later today after up with PT Follow up in office in 2 weeks Will discuss with Dr. Shelle IronBeane Discussed D/C instructions, Lspine precautions, dressing instructions  Wisdom Seybold  M. 08/27/2015, 8:11 AM

## 2015-08-27 NOTE — Progress Notes (Signed)
Occupational Therapy Evaluation Patient Details Name: Wendy Harrell MRN: [id number] DOB: 1975-07-05 Today's Date: 08/27/2015    History of Present Illness s/p MICRO LUMBAR DECOMPRESSION L5 - S1 ON LEFT (Left)   Clinical Impression   All OT education completed and patient questions answered. Patient plans to d/c home today with family assistance as needed. OT will sign off.    Follow Up Recommendations  No OT follow up;Supervision - Intermittent    Equipment Recommendations  None recommended by OT    Recommendations for Other Services PT consult     Precautions / Restrictions Precautions Precautions: Back;Fall Precaution Booklet Issued: Yes (comment) Precaution Comments: educated on back precautions via handout Restrictions Weight Bearing Restrictions: No      Mobility Bed Mobility Overal bed mobility: Needs Assistance Bed Mobility: Rolling;Sidelying to Sit Rolling: Supervision Sidelying to sit: Supervision       General bed mobility comments: cues for log roll technique  Transfers Overall transfer level: Needs assistance Equipment used: None Transfers: Sit to/from Stand Sit to Stand: Supervision              Balance                                            ADL Overall ADL's : Needs assistance/impaired Eating/Feeding: Independent   Grooming: Wash/dry hands;Wash/dry face;Oral care;Applying deodorant;Supervision/safety;Standing           Upper Body Dressing : Supervision/safety;Sitting   Lower Body Dressing: Supervision/safety;Sit to/from stand;Adhering to back precautions   Toilet Transfer: Supervision/safety;Ambulation;Comfort height toilet   Toileting- Clothing Manipulation and Hygiene: Supervision/safety;Sit to/from stand   Tub/ Shower Transfer: Walk-in shower;Supervision/safety;Ambulation   Functional mobility during ADLs: Supervision/safety General ADL Comments: Educated on back precautions and provided handout.  Patient ambulated to bathroom and practiced bathroom transfers, then back out to bed and got dressed. Educated patient about availability of AE, but patient able to don clothes without AE while still maintaining back precautions. Patient groomed at sink, then up to recliner.     Vision     Perception     Praxis      Pertinent Vitals/Pain Pain Assessment: 0-10 Pain Score: 2  Pain Location: back, near incision Pain Descriptors / Indicators: Throbbing Pain Intervention(s): Monitored during session;Premedicated before session;Repositioned     Hand Dominance Right   Extremity/Trunk Assessment Upper Extremity Assessment Upper Extremity Assessment: Overall WFL for tasks assessed   Lower Extremity Assessment Lower Extremity Assessment: Defer to PT evaluation       Communication Communication Communication: No difficulties   Cognition Arousal/Alertness: Awake/alert Behavior During Therapy: WFL for tasks assessed/performed Overall Cognitive Status: Within Functional Limits for tasks assessed                     General Comments       Exercises       Shoulder Instructions      Home Living Family/patient expects to be discharged to:: Private residence Living Arrangements: Spouse/significant other Available Help at Discharge: Family;Available 24 hours/day Type of Home: House Home Access: Stairs to enter Entergy CorporationEntrance Stairs-Number of Steps: 2 Entrance Stairs-Rails: None Home Layout: Two level;Able to live on main level with bedroom/bathroom     Bathroom Shower/Tub: Producer, television/film/videoWalk-in shower   Bathroom Toilet: Standard     Home Equipment: Bedside commode   Additional Comments: BSC is borrowed from her father  Prior Functioning/Environment Level of Independence: Independent             OT Diagnosis: Acute pain   OT Problem List: Decreased strength;Decreased activity tolerance;Decreased knowledge of use of DME or AE;Decreased knowledge of precautions;Pain    OT Treatment/Interventions:      OT Goals(Current goals can be found in the care plan section) Acute Rehab OT Goals Patient Stated Goal: to go home today OT Goal Formulation: All assessment and education complete, DC therapy  OT Frequency:     Barriers to D/C:            Co-evaluation              End of Session Nurse Communication: Mobility status  Activity Tolerance: Patient tolerated treatment well Patient left: in chair;with call bell/phone within reach;with chair alarm set   Time: 1610-9604 OT Time Calculation (min): 30 min Charges:  OT General Charges $OT Visit: 1 Procedure OT Evaluation $OT Eval Low Complexity: 1 Procedure OT Treatments $Self Care/Home Management : 8-22 mins G-Codes: OT G-codes **NOT FOR INPATIENT CLASS** Functional Assessment Tool Used: clinical judgment Functional Limitation: Self care Self Care Current Status (V4098): At least 1 percent but less than 20 percent impaired, limited or restricted Self Care Goal Status (J1914): At least 1 percent but less than 20 percent impaired, limited or restricted Self Care Discharge Status [phone]): At least 1 percent but less than 20 percent impaired, limited or restricted  Wendy Harrell A 08/27/2015, 10:15 AM

## 2015-08-27 NOTE — Evaluation (Signed)
Physical Therapy Evaluation Patient Details Name: Wendy PapKathleen Durnin MRN: [id number] DOB: June 08, 1976 Today's Date: 08/27/2015   History of Present Illness  s/p MICRO LUMBAR DECOMPRESSION L5 - S1 ON LEFT (Left)  Clinical Impression  Pt s/p back surgery presents with functional mobility limitations 2* post op back precautions and pain.  Pt mobilizing at supervision level and plans dc home this date with family assist.    Follow Up Recommendations No PT follow up    Equipment Recommendations  None recommended by PT    Recommendations for Other Services       Precautions / Restrictions Precautions Precautions: Back;Fall Precaution Booklet Issued: Yes (comment) Precaution Comments: Reviewed back precautions Restrictions Weight Bearing Restrictions: No      Mobility  Bed Mobility Overal bed mobility: Needs Assistance Bed Mobility: Rolling;Sidelying to Sit Rolling: Supervision Sidelying to sit: Supervision Supine to sit: Supervision Sit to supine: Supervision   General bed mobility comments: cues for log roll technique  Transfers Overall transfer level: Needs assistance Equipment used: None Transfers: Sit to/from Stand Sit to Stand: Supervision         General transfer comment: cues for transition position, adherence to back precautions and use of UEs  Ambulation/Gait Ambulation/Gait assistance: Min guard;Supervision Ambulation Distance (Feet): 400 Feet Assistive device: Rolling walker (2 wheeled);None Gait Pattern/deviations: Step-through pattern;Decreased step length - right;Decreased step length - left;Shuffle Gait velocity: decr Gait velocity interpretation: Below normal speed for age/gender General Gait Details: 100' with RW and 300; sans assist device  Stairs Stairs: Yes Stairs assistance: Min assist Stair Management: Step to pattern;Forwards Number of Stairs: 2 General stair comments: min cues for sequence with min HHA  Wheelchair Mobility     Modified Rankin (Stroke Patients Only)       Balance                                             Pertinent Vitals/Pain Pain Assessment: 0-10 Pain Score: 2  Pain Location: back Pain Descriptors / Indicators: Throbbing Pain Intervention(s): Limited activity within patient's tolerance;Monitored during session;Premedicated before session    Home Living Family/patient expects to be discharged to:: Private residence Living Arrangements: Spouse/significant other Available Help at Discharge: Family;Available 24 hours/day Type of Home: House Home Access: Stairs to enter Entrance Stairs-Rails: None Entrance Stairs-Number of Steps: 2 Home Layout: Two level;Able to live on main level with bedroom/bathroom Home Equipment: Bedside commode Additional Comments: BSC is borrowed from her father    Prior Function Level of Independence: Independent               Hand Dominance   Dominant Hand: Right    Extremity/Trunk Assessment   Upper Extremity Assessment: Overall WFL for tasks assessed           Lower Extremity Assessment: Overall WFL for tasks assessed         Communication   Communication: No difficulties  Cognition Arousal/Alertness: Awake/alert Behavior During Therapy: WFL for tasks assessed/performed Overall Cognitive Status: Within Functional Limits for tasks assessed                      General Comments      Exercises        Assessment/Plan    PT Assessment Patent does not need any further PT services  PT Diagnosis Difficulty walking   PT Problem List Decreased activity tolerance;Decreased mobility;Pain;Decreased  knowledge of precautions  PT Treatment Interventions DME instruction;Gait training;Stair training;Functional mobility training;Patient/family education   PT Goals (Current goals can be found in the Care Plan section) Acute Rehab PT Goals Patient Stated Goal: to go home today PT Goal Formulation: All  assessment and education complete, DC therapy    Frequency Min 1X/week   Barriers to discharge        Co-evaluation               End of Session   Activity Tolerance: Patient tolerated treatment well Patient left: in bed;with call bell/phone within reach;with bed alarm set Nurse Communication: Mobility status    Functional Assessment Tool Used: clinical judgement Functional Limitation: Mobility: Walking and moving around Mobility: Walking and Moving Around Current Status (O9629): At least 1 percent but less than 20 percent impaired, limited or restricted Mobility: Walking and Moving Around Goal Status [phone]): At least 1 percent but less than 20 percent impaired, limited or restricted Mobility: Walking and Moving Around Discharge Status [phone]): At least 1 percent but less than 20 percent impaired, limited or restricted    Time: 1027-2536 PT Time Calculation (min) (ACUTE ONLY): 22 min   Charges:   PT Evaluation $PT Eval Low Complexity: 1 Procedure     PT G Codes:   PT G-Codes **NOT FOR INPATIENT CLASS** Functional Assessment Tool Used: clinical judgement Functional Limitation: Mobility: Walking and moving around Mobility: Walking and Moving Around Current Status (U4403): At least 1 percent but less than 20 percent impaired, limited or restricted Mobility: Walking and Moving Around Goal Status [phone]): At least 1 percent but less than 20 percent impaired, limited or restricted Mobility: Walking and Moving Around Discharge Status [phone]): At least 1 percent but less than 20 percent impaired, limited or restricted    [facility] 08/27/2015, 12:35 PM

## 2015-08-27 NOTE — Discharge Summary (Signed)
Physician Discharge Summary   Patient ID: Wendy Harrell MRN: [id number] DOB/AGE: 1976/03/04 40 y.o.  Admit date: 08/26/2015 Discharge date: 08/27/2015  Primary Diagnosis:   HNP L5 - S1  Admission Diagnoses:  Past Medical History  Diagnosis Date  . Chicken pox as a child  . 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 Interstitial cystitis 03/01/2011  . Dermatitis 03/01/2011  . Cervical cancer screening 08/29/2013  . Diarrhea 08/23/2014  . Loss of weight 08/23/2014  . Raynaud's disease   . GERD (gastroesophageal reflux disease)   . Slipped cervical disc   . Tachycardia     Since her 20's.  Resting HR of 100-110 is not unusual   Discharge Diagnoses:   Principal Problem:   HNP (herniated nucleus pulposus), lumbar Active Problems:   Spinal stenosis of lumbar region   Sinus tachycardia (HCC)  Procedure:  Procedure(s) (LRB): MICRO LUMBAR DECOMPRESSION  L5 - S1 ON LEFT (Left)   Consults: cardiology for sinus tachycardia  HPI:  see H&P    Laboratory Data: Hospital Outpatient Visit on 08/20/2015  Component Date Value Ref Range Status  . ABO/RH(D) 08/20/2015 A NEG   Final    Recent Labs  08/26/15 1505  HGB 13.3    Recent Labs  08/26/15 1505  WBC 8.8  RBC 4.20  HCT 40.2  PLT 270    Recent Labs  08/26/15 1505  NA 143  K 3.6  CL 108  CO2 25  BUN 7  CREATININE 0.67  GLUCOSE 237*  CALCIUM 9.4   No results for input(s): LABPT, INR in the last 72 hours.  X-Rays:Dg Lumbar Spine 2-3 Views  08/20/2015  CLINICAL DATA:  Preoperative examination prior to anticipated back surgery on August 26, 2015 EXAM: LUMBAR SPINE - 2-3 VIEW COMPARISON:  No previous studies available in PACs. FINDINGS: There are 5 lumbar type non-rib-bearing vertebral bodies. The vertebral bodies are preserved in height. The disc space heights are well maintained with exception of minimal narrowing at L5-S1. There is no spondylolisthesis. The pedicles and transverse processes are intact. The observed portions of  the sacrum are normal. IMPRESSION: There is minimal disc space narrowing at L5-S1. Otherwise the examination is within the limits of normal. Electronically Signed   By: David  [place] M.D.   On: 08/20/2015 13:30   Dg Spine Portable 1 View  08/26/2015  CLINICAL DATA:  Lumbar decompression EXAM: PORTABLE SPINE - 1 VIEW COMPARISON:  Study obtained earlier in the day FINDINGS: Cross-table lateral image is labeled #3. Metallic probe tip overlies the L5-S1 interspace from a posterior approach. No fracture or spondylolisthesis. There is moderate disc space narrowing at L4-5 and L5-S1. IMPRESSION: Metallic probe tip overlies the L5-S1 interspace from a posterior approach. There is disc space narrowing at L4-5 and L5-S1. No fracture or spondylolisthesis. Electronically Signed   By: Bretta Bang III M.D.   On: 08/26/2015 09:19   Dg Spine Portable 1 View  08/26/2015  CLINICAL DATA:  Intraoperative localization for L5-S1 decompression EXAM: PORTABLE SPINE - 1 VIEW COMPARISON:  08/20/2015, 08/26/2015 FINDINGS: Surgical retractors now seen posterior to the S1 level with surgical instruments at the L5-S1 posterior elements. The numbering nomenclature is similar to that utilized on prior plain film examination. IMPRESSION: Intraoperative localization at L5-S1. Electronically Signed   By: Alcide Clever M.D.   On: 08/26/2015 08:23   Dg Spine Portable 1 View  08/26/2015  CLINICAL DATA:  intraoperative film #1, please number spinous process for surgeon, L5-S1 lumbar decompression l EXAM: PORTABLE SPINE -  1 VIEW COMPARISON:  08/20/2015 FINDINGS: FILM #1: A surgical instrument is identified posterior to the vertebral body of S1, just below the L5 spinous process. IMPRESSION: Intraoperative localization. Electronically Signed   By: Norva Pavlov M.D.   On: 08/26/2015 08:23    EKG: Orders placed or performed during the hospital encounter of 08/26/15  . EKG 12-Lead  . EKG 12-Lead     Hospital Course: Patient was  admitted to [facility] and taken to the OR and underwent the above state procedure without complications.  Patient tolerated the procedure well and was later transferred to the recovery room and then to the orthopaedic floor for postoperative care.  They were given PO and IV analgesics for pain control following their surgery.  They were given 24 hours of postoperative antibiotics.   PT was consulted postop to assist with mobility and transfers.  The patient was allowed to be WBAT with therapy and was taught back precautions. Discharge planning was consulted to help with postop disposition and equipment needs. Cardiology was consulted same day of surgery for sinus tachycardia in PACU then increased HR when on floor up to 149. This improved with metoprolol IV then PO. She apparently has had an extensive workup for this in the past. She will be sent home with metoprolol to take 12.5mg  BID prn per cardiology instructions. Patient had a fair night on the evening of surgery and started to get up OOB with therapy on day one. Patient was seen in rounds and was ready to go home on day one.  They were given discharge instructions and dressing directions.  They were instructed on when to follow up in the office with Dr. Shelle Iron.   Diet: Regular diet Activity:WBAT; LSpine precautions Follow-up:in 10-14 days Disposition - Home Discharged Condition: good   Discharge Instructions    Call MD / Call 911    Complete by:  As directed   If you experience chest pain or shortness of breath, CALL 911 and be transported to the hospital emergency room.  If you develope a fever above 101 F, pus (white drainage) or increased drainage or redness at the wound, or calf pain, call your surgeon's office.     Constipation Prevention    Complete by:  As directed   Drink plenty of fluids.  Prune juice may be helpful.  You may use a stool softener, such as Colace (over the counter) 100 mg twice a day.  Use MiraLax (over the  counter) for constipation as needed.     Diet - low sodium heart healthy    Complete by:  As directed      Increase activity slowly as tolerated    Complete by:  As directed             Medication List    TAKE these medications        docusate sodium 100 MG capsule  Commonly known as:  COLACE  Take 1 capsule (100 mg total) by mouth 2 (two) times daily as needed for mild constipation.     gabapentin 300 MG capsule  Commonly known as:  NEURONTIN  Take 300 mg by mouth daily.     HYDROcodone-acetaminophen 5-325 MG tablet  Commonly known as:  NORCO/VICODIN  Take 1-2 tablets by mouth every 4 (four) hours as needed for severe pain.     metoprolol tartrate 25 MG tablet  Commonly known as:  LOPRESSOR  Take 0.5 tablets (12.5 mg total) by mouth 2 (two) times  daily.     ondansetron 8 MG tablet  Commonly known as:  ZOFRAN  Take 1 tablet (8 mg total) by mouth every 8 (eight) hours as needed for nausea or vomiting.     VITAMIN B 12 PO  Take 1 tablet by mouth daily.     Vitamin D3 50000 units Caps  Take 1 capsule by mouth every 7 (seven) days.           Follow-up Information    Follow up with BEANE,JEFFREY C, MD In 2 weeks.   Specialty:  Orthopedic Surgery   Why:  For suture removal   Contact information:   [address] [place] [address] [phone]       Signed: Andrez GrimeJaclyn Dajane Valli, PA-C Orthopaedic Surgery 08/27/2015, 8:47 AM

## 2015-09-02 ENCOUNTER — Telehealth: Payer: Self-pay | Admitting: Nurse Practitioner

## 2015-09-02 NOTE — Telephone Encounter (Signed)
Per Dr. Elease HashimotoNahser Wendy Harrell was [facility] after back surgery - impressive sinus tach at 155 that slowed to 100 with metoprolol 5 mv IV  She will go home on metoprlol 12.5 bid.  Have asked her to record BP and HR daily .  She will need to see us in several weeks - ? 4-6 weeks  Thanks   Left message for patient to call back and schedule an appointment for 4 weeks with Dr. Elease HashimotoNahser

## 2015-09-06 ENCOUNTER — Encounter: Payer: BLUE CROSS/BLUE SHIELD | Admitting: Family Medicine

## 2015-09-07 ENCOUNTER — Other Ambulatory Visit: Payer: Self-pay | Admitting: Family Medicine

## 2015-09-07 NOTE — Telephone Encounter (Signed)
Spoke to the patient and went ahead updated the medication list, but have not sent in yet. She takes 25 mg qhs.  Dr. Annabell HowellsWrenn the urologist prescribed 15 years ago after bladder surgery. Currently she is taking 300 mg of Neurontin at night prescribed by her surgeon, but does not plan to continue as he started her on it before surgery and she states it does not help, she plans to stop it.

## 2015-09-07 NOTE — Telephone Encounter (Signed)
Can be reached: [phone] Pharmacy: [id number] - [place],  - [address]  Reason for call: Pt called in requesting refill for amitriptyline. She said that she had to cancel her appt last week because she had emergency back surgery 08/26/15. She is in PT for that. She is not taking any pain medications/narcotics. She is only taking advil and 81mg  of aspirin to prevent clotting. She is not able to sleep and took the amitriptyline years ago to help with sleep. She was not able to get an appt with Dr. Abner GreenspanBlyth for about a month and she cannot drive per her surgeon for at least another week.

## 2015-09-07 NOTE — Telephone Encounter (Signed)
I am willing to rx the Amitriptylien 25 mg po qhs #30 2ith 5 rf or #90 with 1 rf

## 2015-09-07 NOTE — Telephone Encounter (Signed)
I am willing to help her out but I do not see where we have ever prescribed Amitriptyline. We prescribed Neurontin. If she has Amitriptyline what is the dose and what is the sig and does it help without side effects. Who prescribed it to start with, then I can refill til seen. If she is taking the neurontin still also how many times a day and how much

## 2015-09-09 MED ORDER — AMITRIPTYLINE HCL 25 MG PO TABS: 25.0000 mg | ORAL_TABLET | Freq: Every day | ORAL | Status: DC

## 2015-09-09 NOTE — Telephone Encounter (Signed)
Called left detailed message prescription sent in. 

## 2015-09-09 NOTE — Telephone Encounter (Signed)
Sent in refill

## 2015-09-13 ENCOUNTER — Ambulatory Visit (INDEPENDENT_AMBULATORY_CARE_PROVIDER_SITE_OTHER): Payer: BLUE CROSS/BLUE SHIELD | Admitting: Cardiovascular Disease

## 2015-09-13 ENCOUNTER — Encounter: Payer: Self-pay | Admitting: Cardiovascular Disease

## 2015-09-13 VITALS — BP 106/80 | HR 108 | Ht 61.0 in | Wt 123.4 lb

## 2015-09-13 DIAGNOSIS — R000 Tachycardia, unspecified: Secondary | ICD-10-CM | POA: Diagnosis not present

## 2015-09-13 MED ORDER — METOPROLOL SUCCINATE ER 25 MG PO TB24: 12.5000 mg | ORAL_TABLET | Freq: Every day | ORAL | Status: DC

## 2015-09-13 NOTE — Progress Notes (Signed)
Cardiology Office Note   Date:  09/13/2015   ID:  Wendy Harrell, DOB 02/04/1976, MRN [id number]  PCP:  Danise Edge, MD  Cardiologist:   Vesta Mixer, MD   Chief Complaint  Patient presents with  . Follow-up    sinus tachycardia   Problem List 1. Sinus tachycardia    History of Present Illness: Wendy Harrell is a 40 y.o. female who presents for follow up of her sinus tachycardia . Responds very well to metoprolol .  Tried metoprolol 12. 5 BID and felt sluggish    She has not been taking the metoprolol because it made her feel sluggish and she thought that her heart rate was too slow. She still is taking pain pills and Neurontin for her neuropathy.  Past Medical History  Diagnosis Date  . Chicken pox as a child  . 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 Interstitial cystitis 03/01/2011  . Dermatitis 03/01/2011  . Cervical cancer screening 08/29/2013  . Diarrhea 08/23/2014  . Loss of weight 08/23/2014  . Raynaud's disease   . GERD (gastroesophageal reflux disease)   . Slipped cervical disc   . Tachycardia     Since her 20's.  Resting HR of 100-110 is not unusual    Past Surgical History  Procedure Laterality Date  . Laproscopy  2001  . Cystoscopy  2001    benign  . Lumbar laminectomy/decompression microdiscectomy Left 08/26/2015    Procedure: MICRO LUMBAR DECOMPRESSION  L5 - S1 ON LEFT;  Surgeon: Jene Every, MD;  Location: [facility];  Service: Orthopedics;  Laterality: Left;     Current Outpatient Prescriptions  Medication Sig Dispense Refill  . gabapentin (NEURONTIN) 300 MG capsule Take 300 mg by mouth daily.  0  . metoprolol tartrate (LOPRESSOR) 25 MG tablet Take 0.5 tablets (12.5 mg total) by mouth 2 (two) times daily. (Patient not taking: Reported on 09/13/2015) 180 tablet 3   No current facility-administered medications for this visit.    Allergies:   Adhesive and Sulfa antibiotics    Social History:  The patient  reports that she has never smoked. She has never  used smokeless tobacco. She reports that she does not drink alcohol or use illicit drugs.   Family History:  The patient's family history includes Alcohol abuse in her maternal grandfather, paternal grandfather, and paternal grandmother; Alzheimer's disease in her maternal aunt; Anxiety disorder in her father; Arthritis in her father; Cancer in her maternal grandmother; Colon polyps in her father; Depression in her father and paternal grandmother; Diabetes in her father; Gallbladder disease in her maternal aunt, maternal grandmother, and maternal uncle; Heart attack in her paternal grandfather; Heart disease in her father; Hyperlipidemia in her father and mother; Hypertension in her father. There is no history of Colon cancer or Esophageal cancer.    ROS:  Please see the history of present illness.    Review of Systems: Constitutional:  denies fever, chills, diaphoresis, appetite change and fatigue.  HEENT: denies photophobia, eye pain, redness, hearing loss, ear pain, congestion, sore throat, rhinorrhea, sneezing, neck pain, neck stiffness and tinnitus.  Respiratory: denies SOB, DOE, cough, chest tightness, and wheezing.  Cardiovascular: denies chest pain, palpitations and leg swelling.  Gastrointestinal: denies nausea, vomiting, abdominal pain, diarrhea, constipation, blood in stool.  Genitourinary: denies dysuria, urgency, frequency, hematuria, flank pain and difficulty urinating.  Musculoskeletal: admits to  myalgias, back pain, and gait problem.   Skin: denies pallor, rash and wound.  Neurological: denies dizziness, seizures, syncope, weakness, light-headedness, numbness  and headaches.   Hematological: denies adenopathy, easy bruising, personal or family bleeding history.  Psychiatric/ Behavioral: denies suicidal ideation, mood changes, confusion, nervousness, sleep disturbance and agitation.       All other systems are reviewed and negative.    PHYSICAL EXAM: VS:  BP 106/80 mmHg   Pulse 108  Ht 5\' 1"  (1.549 m)  Wt 123 lb 6.4 oz (55.974 kg)  BMI 23.33 kg/m2  LMP 08/20/2015 , BMI Body mass index is 23.33 kg/(m^2). GEN: Well nourished, well developed, in no acute distress HEENT: normal Neck: no JVD, carotid bruits, or masses Cardiac: RRR, tachycardia ; no murmurs, rubs, or gallops,no edema  Respiratory:  clear to auscultation bilaterally, normal work of breathing GI: soft, nontender, nondistended, + BS MS: no deformity or atrophy Skin: warm and dry, no rash Neuro:  Strength and sensation are intact Psych: normal   EKG:  EKG is ordered today. The ekg ordered today demonstrates  Sinus tach at 108.   NS ST abn.   Recent Labs: 08/26/2015: BUN 7; Creatinine, Ser 0.67; Hemoglobin 13.3; Platelets 270; Potassium 3.6; Sodium 143; TSH 0.582    Lipid Panel    Component Value Date/Time   CHOL 190 09/02/2014 0956   TRIG 53.0 09/02/2014 0956   HDL 58.10 09/02/2014 0956   CHOLHDL 3 09/02/2014 0956   VLDL 10.6 09/02/2014 0956   LDLCALC 121* 09/02/2014 0956      Wt Readings from Last 3 Encounters:  09/13/15 123 lb 6.4 oz (55.974 kg)  08/26/15 125 lb 9.6 oz (56.972 kg)  08/20/15 125 lb 9.6 oz (56.972 kg)      Other studies Reviewed: Additional studies/ records that were reviewed today include: . Review of the above records demonstrates:    ASSESSMENT AND PLAN:  1.  Sinus tachycardia: Wendy Harrell continues to have episodes of sinus tachycardia. We will stop the metoprolol and start her on Toprol-XL 12.5 mg a day. She'll take it at night. I'll see her again in 3 months. We'll get Wendy Harrell to send in some heart rate and blood pressure information via my chart in the next several weeks. She'll call me if she has any additional problems.   Current medicines are reviewed at length with the patient today.  The patient does not have concerns regarding medicines.  The following changes have been made:  no change  Labs/ tests ordered today include:  No orders of the  defined types were placed in this encounter.     Disposition:   FU with me in 3 months      Nahser, Deloris PingPhilip J, MD  09/13/2015 2:02 PM    [facility] [address], [place], [place]  [address] Phone: [phone]; Fax: [phone]   [facility]  [address] [place], [place]  [address] [phone]   Fax [phone]

## 2015-09-13 NOTE — Patient Instructions (Addendum)
Medication Instructions:  Your physician has recommended you make the following change in your medication:  1- STOP Metoprolol 2-START Toprol XL 12.5 mg by mouth daily.  Labwork: NONE  Testing/Procedures: NONE  Follow-Up: Your physician wants you to follow-up in: 3 months with Dr. Elease HashimotoNahser with an EKG If you need a refill on your cardiac medications before your next appointment, please call your pharmacy.

## 2015-10-07 ENCOUNTER — Ambulatory Visit (INDEPENDENT_AMBULATORY_CARE_PROVIDER_SITE_OTHER): Payer: BLUE CROSS/BLUE SHIELD | Admitting: Family Medicine

## 2015-10-07 ENCOUNTER — Encounter: Payer: Self-pay | Admitting: Family Medicine

## 2015-10-07 VITALS — BP 94/68 | HR 101 | Temp 98.5°F | Ht 61.0 in | Wt 124.2 lb

## 2015-10-07 DIAGNOSIS — Z0000 Encounter for general adult medical examination without abnormal findings: Secondary | ICD-10-CM | POA: Diagnosis not present

## 2015-10-07 DIAGNOSIS — R000 Tachycardia, unspecified: Secondary | ICD-10-CM

## 2015-10-07 DIAGNOSIS — F411 Generalized anxiety disorder: Secondary | ICD-10-CM | POA: Diagnosis not present

## 2015-10-07 DIAGNOSIS — E538 Deficiency of other specified B group vitamins: Secondary | ICD-10-CM

## 2015-10-07 DIAGNOSIS — M48061 Spinal stenosis, lumbar region without neurogenic claudication: Secondary | ICD-10-CM

## 2015-10-07 DIAGNOSIS — E559 Vitamin D deficiency, unspecified: Secondary | ICD-10-CM

## 2015-10-07 DIAGNOSIS — M4806 Spinal stenosis, lumbar region: Secondary | ICD-10-CM

## 2015-10-07 HISTORY — DX: Vitamin D deficiency, unspecified: E55.9

## 2015-10-07 MED ORDER — FLUCONAZOLE 150 MG PO TABS: 150.0000 mg | ORAL_TABLET | Freq: Once | ORAL | Status: AC

## 2015-10-07 MED ORDER — ESCITALOPRAM OXALATE 10 MG PO TABS: 10.0000 mg | ORAL_TABLET | Freq: Every day | ORAL | Status: DC

## 2015-10-07 MED ORDER — VALACYCLOVIR HCL 1 G PO TABS: 1000.0000 mg | ORAL_TABLET | Freq: Two times a day (BID) | ORAL | Status: DC

## 2015-10-07 NOTE — Progress Notes (Signed)
Subjective:    Patient ID: Wendy Harrell, female    DOB: 10-11-1975, 40 y.o.   MRN: [id number]  Chief Complaint  Patient presents with  . Anxiety    vitamin D    HPI Patient is in today for concerns with anxiety. Patient has been struggling with anxiety due to child having some sensory and mental concerns. Patient reports of back pain surgery in march and pulse has been running between 85-95.  Patient reports that there as some tachycardia going on at at the hospital the day of her surgery, but has some concern that her blood pressure had gotten so low she blacked out, but since then she has had some concern about hypotension.   Patient has some concerns about vitamin D that she is taking which is causing her some issues with diarrhea.  Patient gets cold sores on chest and would like medication to help with treatment.   Past Medical History  Diagnosis Date  . Chicken pox as a child  . 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 Interstitial cystitis 03/01/2011  . Dermatitis 03/01/2011  . Cervical cancer screening 08/29/2013  . Diarrhea 08/23/2014  . Loss of weight 08/23/2014  . Raynaud's disease   . GERD (gastroesophageal reflux disease)   . Slipped cervical disc   . Tachycardia     Since her 20's.  Resting HR of 100-110 is not unusual  . Vitamin D deficiency 10/07/2015  . Vitamin B 12 deficiency 10/21/2015    Past Surgical History  Procedure Laterality Date  . Laproscopy  2001  . Cystoscopy  2001    benign  . Lumbar laminectomy/decompression microdiscectomy Left 08/26/2015    Procedure: MICRO LUMBAR DECOMPRESSION  L5 - S1 ON LEFT;  Surgeon: Jene Every, MD;  Location: [facility];  Service: Orthopedics;  Laterality: Left;    Family History  Problem Relation Age of Onset  . Depression Father   . Heart disease Father   . Hypertension Father   . Hyperlipidemia Father   . Diabetes Father     type 2  . Arthritis Father   . Anxiety disorder Father   . Alcohol abuse Maternal Grandfather   .  Depression Paternal Grandmother   . Alcohol abuse Paternal Grandmother   . Alcohol abuse Paternal Grandfather   . Heart attack Paternal Grandfather   . Cancer Maternal Grandmother     skin cancer, breast cancer  . Alzheimer's disease Maternal Aunt   . Hyperlipidemia Mother   . Colon cancer Neg Hx   . Colon polyps Father   . Esophageal cancer Neg Hx   . Gallbladder disease Maternal Aunt     x4  . Gallbladder disease Maternal Uncle   . Gallbladder disease Maternal Grandmother     Social History   Social History  . Marital Status: Married    Spouse Name: N/A  . Number of Children: 1  . Years of Education: N/A   Occupational History  . 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 Smoking status: Never Smoker   . Smokeless tobacco: Never Used  . Alcohol Use: No  . Drug Use: No  . Sexual Activity:    Partners: Male    Birth Control/ Protection: Surgical   Other Topics Concern  . Not on file   Social History Narrative    Outpatient Prescriptions Prior to Visit  Medication Sig Dispense Refill  . gabapentin (NEURONTIN) 300 MG capsule Take 300 mg by mouth daily.  0  . metoprolol  succinate (TOPROL XL) 25 MG 24 hr tablet Take 0.5 tablets (12.5 mg total) by mouth daily. (Patient not taking: Reported on 10/07/2015) 45 tablet 3   No facility-administered medications prior to visit.    Allergies  Allergen Reactions  . Adhesive [Tape] Rash    Band-Aids gives pt a rash  . Sulfa Antibiotics Rash    Blisters in the mouth    Review of Systems  Constitutional: Negative for fever and malaise/fatigue.  HENT: Negative for congestion.   Eyes: Negative for blurred vision.  Cardiovascular: Negative for leg swelling.  Gastrointestinal: Negative for abdominal pain and blood in stool.  Genitourinary: Negative for dysuria and frequency.  Musculoskeletal: Negative for falls.  Skin: Negative for rash.  Neurological: Negative for loss of consciousness and headaches.    Endo/Heme/Allergies: Negative for environmental allergies.  Psychiatric/Behavioral: Negative for depression.       Objective:    Physical Exam  Constitutional: She is oriented to person, place, and time. She appears well-developed and well-nourished. No distress.  HENT:  Head: Normocephalic and atraumatic.  Eyes: Conjunctivae are normal.  Neck: Neck supple. No thyromegaly present.  Cardiovascular: Normal rate, regular rhythm and normal heart sounds.   No murmur heard. Pulmonary/Chest: Effort normal and breath sounds normal. No respiratory distress.  Abdominal: Soft. Bowel sounds are normal. She exhibits no distension and no mass. There is no tenderness.  Musculoskeletal: She exhibits no edema.  Lymphadenopathy:    She has no cervical adenopathy.  Neurological: She is alert and oriented to person, place, and time.  Skin: Skin is warm and dry.  Psychiatric: She has a normal mood and affect. Her behavior is normal.    BP 94/68 mmHg  Pulse 101  Temp(Src) 98.5 F (36.9 C) (Oral)  Ht  (1.549 m)  Wt 124 lb 4 oz (56.359 kg)  BMI 23.49 kg/m2  SpO2 96% Wt Readings from Last 3 Encounters:  10/07/15 124 lb 4 oz (56.359 kg)  09/13/15 123 lb 6.4 oz (55.974 kg)  08/26/15 125 lb 9.6 oz (56.972 kg)     Lab Results  Component Value Date   WBC 8.8 08/26/2015   HGB 13.3 08/26/2015   HCT 40.2 08/26/2015   PLT 270 08/26/2015   GLUCOSE 92 10/07/2015   CHOL 190 09/02/2014   TRIG 53.0 09/02/2014   HDL 58.10 09/02/2014   LDLCALC 121* 09/02/2014   ALT 13 10/07/2015   AST 15 10/07/2015   NA 137 10/07/2015   K 4.3 10/07/2015   CL 103 10/07/2015   CREATININE 0.62 10/07/2015   BUN 13 10/07/2015   CO2 27 10/07/2015   TSH 1.33 10/07/2015   INR 1.05 08/20/2015    Lab Results  Component Value Date   TSH 1.33 10/07/2015   Lab Results  Component Value Date   WBC 8.8 08/26/2015   HGB 13.3 08/26/2015   HCT 40.2 08/26/2015   MCV 95.7 08/26/2015   PLT 270 08/26/2015   Lab  Results  Component Value Date   NA 137 10/07/2015   K 4.3 10/07/2015   CO2 27 10/07/2015   GLUCOSE 92 10/07/2015   BUN 13 10/07/2015   CREATININE 0.62 10/07/2015   BILITOT 0.4 10/07/2015   ALKPHOS 57 10/07/2015   AST 15 10/07/2015   ALT 13 10/07/2015   PROT 7.8 10/07/2015   ALBUMIN 4.6 10/07/2015   CALCIUM 9.9 10/07/2015   ANIONGAP 10 08/26/2015   GFR 113.55 10/07/2015   Lab Results  Component Value Date   CHOL 190  09/02/2014   Lab Results  Component Value Date   HDL 58.10 09/02/2014   Lab Results  Component Value Date   LDLCALC 121* 09/02/2014   Lab Results  Component Value Date   TRIG 53.0 09/02/2014   Lab Results  Component Value Date   CHOLHDL 3 09/02/2014   No results found for: HGBA1C     Assessment & Plan:   Problem List Items Addressed This Visit    Vitamin D deficiency    Continue supplements and monitor      Relevant Medications   valACYclovir (VALTREX) 1000 MG tablet   Other Relevant Orders   VITAMIN D 25 Hydroxy (Vit-D Deficiency, Fractures) (Completed)   TSH (Completed)   Comprehensive metabolic panel (Completed)   Vitamin B12 (Completed)   Vitamin B 12 deficiency    Start a Vitamin B12 1000 mcg SL tab daily and recheck level with IF      Spinal stenosis of lumbar region    Has struggled with chronic pain and resulting tachycardia and increased anxiety, is following with Dr Shelle IronBeane and is trying to manage her symptoms s/p surgery      Sinus tachycardia (HCC) - Primary    Mild today, no changes did not tolerate Lopressor      Relevant Medications   valACYclovir (VALTREX) 1000 MG tablet   Other Relevant Orders   VITAMIN D 25 Hydroxy (Vit-D Deficiency, Fractures) (Completed)   TSH (Completed)   Comprehensive metabolic panel (Completed)   Vitamin B12 (Completed)   Preventative health care (Chronic)   Relevant Medications   valACYclovir (VALTREX) 1000 MG tablet   Other Relevant Orders   VITAMIN D 25 Hydroxy (Vit-D Deficiency,  Fractures) (Completed)   TSH (Completed)   Comprehensive metabolic panel (Completed)   Vitamin B12 (Completed)   Generalized anxiety disorder    Worsened recently, will add SSRI and monitor she will notify us with any concerns         I am having Ms. Erdmann start on valACYclovir and Vitamin B-12. I am also having her maintain her gabapentin and metoprolol succinate.  Meds ordered this encounter  Medications  . DISCONTD: escitalopram (LEXAPRO) 10 MG tablet    Sig: Take 1 tablet (10 mg total) by mouth daily.    Dispense:  30 tablet    Refill:  2  . fluconazole (DIFLUCAN) 150 MG tablet    Sig: Take 1 tablet (150 mg total) by mouth once.    Dispense:  1 tablet    Refill:  0  . valACYclovir (VALTREX) 1000 MG tablet    Sig: Take 1 tablet (1,000 mg total) by mouth 2 (two) times daily. X 1 week as needed    Dispense:  14 tablet    Refill:  1  . Cyanocobalamin (VITAMIN B-12) 1000 MCG SUBL    Sig: Place 1 tablet (1,000 mcg total) under the tongue daily.    Refill:  0     Danise EdgeBLYTH, STACEY, MD

## 2015-10-07 NOTE — Patient Instructions (Addendum)
NOW Probiotic Vitamin. Available at [facility]. Generalized Anxiety Disorder Generalized anxiety disorder (GAD) is a mental disorder. It interferes with life functions, including relationships, work, and school. GAD is different from normal anxiety, which everyone experiences at some point in their lives in response to specific life events and activities. Normal anxiety actually helps us prepare for and get through these life events and activities. Normal anxiety goes away after the event or activity is over.  GAD causes anxiety that is not necessarily related to specific events or activities. It also causes excess anxiety in proportion to specific events or activities. The anxiety associated with GAD is also difficult to control. GAD can vary from mild to severe. People with severe GAD can have intense waves of anxiety with physical symptoms (panic attacks).  SYMPTOMS The anxiety and worry associated with GAD are difficult to control. This anxiety and worry are related to many life events and activities and also occur more days than not for 6 months or longer. People with GAD also have three or more of the following symptoms (one or more in children):  Restlessness.   Fatigue.  Difficulty concentrating.   Irritability.  Muscle tension.  Difficulty sleeping or unsatisfying sleep. DIAGNOSIS GAD is diagnosed through an assessment by your health care provider. Your health care provider will ask you questions aboutyour mood,physical symptoms, and events in your life. Your health care provider may ask you about your medical history and use of alcohol or drugs, including prescription medicines. Your health care provider may also do a physical exam and blood tests. Certain medical conditions and the use of certain substances can cause symptoms similar to those associated with GAD. Your health care provider may refer you to a mental health specialist for further evaluation. TREATMENT The  following therapies are usually used to treat GAD:   Medication. Antidepressant medication usually is prescribed for long-term daily control. Antianxiety medicines may be added in severe cases, especially when panic attacks occur.   Talk therapy (psychotherapy). Certain types of talk therapy can be helpful in treating GAD by providing support, education, and guidance. A form of talk therapy called cognitive behavioral therapy can teach you healthy ways to think about and react to daily life events and activities.  Stress managementtechniques. These include yoga, meditation, and exercise and can be very helpful when they are practiced regularly. A mental health specialist can help determine which treatment is best for you. Some people see improvement with one therapy. However, other people require a combination of therapies.   This information is not intended to replace advice given to you by your health care provider. Make sure you discuss any questions you have with your health care provider.   Document Released: 09/23/2012 Document Revised: 06/19/2014 Document Reviewed: 09/23/2012 Elsevier Interactive Patient Education [employer]2016 [employer] Inc[employer]

## 2015-10-07 NOTE — Progress Notes (Signed)
Pre visit review using our clinic review tool, if applicable. No additional management support is needed unless otherwise documented below in the visit note. 

## 2015-10-07 NOTE — Assessment & Plan Note (Signed)
Mild today, no changes did not tolerate Lopressor

## 2015-10-08 LAB — VITAMIN D 25 HYDROXY (VIT D DEFICIENCY, FRACTURES): VITD: 22.39 ng/mL — ABNORMAL LOW (ref 30.00–100.00)

## 2015-10-08 LAB — COMPREHENSIVE METABOLIC PANEL
ALBUMIN: 4.6 g/dL (ref 3.5–5.2)
ALK PHOS: 57 U/L (ref 39–117)
ALT: 13 U/L (ref 0–35)
AST: 15 U/L (ref 0–37)
BILIRUBIN TOTAL: 0.4 mg/dL (ref 0.2–1.2)
BUN: 13 mg/dL (ref 6–23)
CALCIUM: 9.9 mg/dL (ref 8.4–10.5)
CO2: 27 mEq/L (ref 19–32)
CREATININE: 0.62 mg/dL (ref 0.40–1.20)
Chloride: 103 mEq/L (ref 96–112)
GFR: 113.55 mL/min (ref >60.00)
Glucose, Bld: 92 mg/dL (ref 70–99)
Potassium: 4.3 mEq/L (ref 3.5–5.1)
SODIUM: 137 meq/L (ref 135–145)
TOTAL PROTEIN: 7.8 g/dL (ref 6.0–8.3)

## 2015-10-08 LAB — VITAMIN B12: VITAMIN B 12: 189 pg/mL — AB (ref 211–911)

## 2015-10-08 LAB — TSH: TSH: 1.33 u[IU]/mL (ref 0.35–4.50)

## 2015-10-11 ENCOUNTER — Other Ambulatory Visit: Payer: Self-pay | Admitting: Family Medicine

## 2015-10-11 DIAGNOSIS — E538 Deficiency of other specified B group vitamins: Secondary | ICD-10-CM

## 2015-10-11 DIAGNOSIS — E559 Vitamin D deficiency, unspecified: Secondary | ICD-10-CM

## 2015-10-11 MED ORDER — FLUCONAZOLE 150 MG PO TABS: ORAL_TABLET | ORAL | Status: DC

## 2015-10-11 MED ORDER — VITAMIN D (ERGOCALCIFEROL) 1.25 MG (50000 UNIT) PO CAPS: 50000.0000 [IU] | ORAL_CAPSULE | ORAL | Status: DC

## 2015-10-20 ENCOUNTER — Encounter: Payer: Self-pay | Admitting: Family Medicine

## 2015-10-21 ENCOUNTER — Encounter: Payer: Self-pay | Admitting: Family Medicine

## 2015-10-21 ENCOUNTER — Other Ambulatory Visit: Payer: Self-pay | Admitting: Family Medicine

## 2015-10-21 DIAGNOSIS — E538 Deficiency of other specified B group vitamins: Secondary | ICD-10-CM

## 2015-10-21 DIAGNOSIS — E559 Vitamin D deficiency, unspecified: Secondary | ICD-10-CM

## 2015-10-21 DIAGNOSIS — F411 Generalized anxiety disorder: Secondary | ICD-10-CM | POA: Insufficient documentation

## 2015-10-21 HISTORY — DX: Deficiency of other specified B group vitamins: E53.8

## 2015-10-21 MED ORDER — PAROXETINE HCL 10 MG PO TABS: 10.0000 mg | ORAL_TABLET | Freq: Every day | ORAL | Status: DC

## 2015-10-21 MED ORDER — VITAMIN B-12 1000 MCG SL SUBL: 1000.0000 ug | SUBLINGUAL_TABLET | Freq: Every day | SUBLINGUAL | Status: DC

## 2015-10-21 NOTE — Assessment & Plan Note (Signed)
Worsened recently, will add SSRI and monitor she will notify us with any concerns

## 2015-10-21 NOTE — Assessment & Plan Note (Signed)
Has struggled with chronic pain and resulting tachycardia and increased anxiety, is following with Dr Shelle IronBeane and is trying to manage her symptoms s/p surgery

## 2015-10-21 NOTE — Assessment & Plan Note (Signed)
Start a Vitamin B12 1000 mcg SL tab daily and recheck level with IF

## 2015-10-21 NOTE — Assessment & Plan Note (Signed)
Continue supplements and monitor 

## 2015-10-28 ENCOUNTER — Encounter: Payer: Self-pay | Admitting: Family Medicine

## 2015-10-28 ENCOUNTER — Ambulatory Visit (INDEPENDENT_AMBULATORY_CARE_PROVIDER_SITE_OTHER): Payer: BLUE CROSS/BLUE SHIELD | Admitting: Family Medicine

## 2015-10-28 VITALS — BP 113/76 | HR 124 | Temp 98.4°F | Ht 61.0 in | Wt 122.8 lb

## 2015-10-28 DIAGNOSIS — I499 Cardiac arrhythmia, unspecified: Secondary | ICD-10-CM

## 2015-10-28 DIAGNOSIS — F411 Generalized anxiety disorder: Secondary | ICD-10-CM | POA: Diagnosis not present

## 2015-10-28 DIAGNOSIS — I471 Supraventricular tachycardia: Secondary | ICD-10-CM

## 2015-10-28 LAB — TROPONIN I: TNIDX: 0 ug/l (ref 0.00–0.06)

## 2015-10-28 MED ORDER — PROPRANOLOL HCL 10 MG PO TABS: ORAL_TABLET | ORAL | Status: DC

## 2015-10-28 MED ORDER — METOPROLOL SUCCINATE ER 25 MG PO TB24: 12.5000 mg | ORAL_TABLET | Freq: Every day | ORAL | Status: DC

## 2015-10-28 MED ORDER — LORAZEPAM 1 MG PO TABS: ORAL_TABLET | ORAL | Status: DC

## 2015-10-28 NOTE — Patient Instructions (Signed)
Please start on the metoprolol once a day While this takes effect, use the 10 mg of propranolol up to every 8 hours as needed to help control your heart rate You can also use the lorazepam as needed for anxiety Let me know if these measures are not getting your pulse under 100!  We will set you up for an echocardiogram next week You will get a call from cardiology - they want to see you in about 2 weeks

## 2015-10-28 NOTE — Progress Notes (Signed)
Pre visit review using our clinic review tool, if applicable. No additional management support is needed unless otherwise documented below in the visit note. 

## 2015-10-28 NOTE — Progress Notes (Signed)
[facility] [address], [address] [place], [place] [id number] [phone]  Date:  10/28/2015   Name:  Wendy PapKathleen Harrell   DOB:  [birth year]   MRN:  [id number]  PCP:  Danise EdgeBLYTH, STACEY, MD    Chief Complaint: Irregular Heart Beat   History of Present Illness:  Wendy Harrell is a 40 y.o. very pleasant female patient who presents with the following:  History of tachycardia since her 3620s. This has been episodic- sometimes troublesome, sometimes not  Yesterday am she awoke early with feeling of tachycardia.  Again this am she awoke with sensation of racing heart, counted her pulse at 130.   She took a lorazepam that she had left over from her recent MRI- it did help with her heart rate and she was able to get back to sleep, feels a bit better now.  However she can still tell that her pulse is too fast She went for a walk with her daughter today and this made her feel better- she did not have any SOB or distress with exertion  Described a feeling of severe chest heaviness yesterday and the day before- this is better today.  Denies any chest pain  Around the time of her back surgery in March she was noted to have tachycardia. She saw Dr. Elease HashimotoNahser for same in APril- they planned to start her on toprol xl 12.5 mg.  However admits that she never took this due to concerns about her BP getting too low.  She was afraid of passing out  She did wear a holter monitor when she was in her 2220s.during pregnancy Never had a cath or a stress.  She did have an echo during pregnancy prior to having a c section. This was normal per her report.   She has not had any fever.  She does tend to have chronic diarrhea but this is actually better now.  She is eating, drinking ok.    LMP was 5/1 Here today with her husband Recent TSH normal  Lab Results  Component Value Date   TSH 1.33 10/07/2015   Pulse Readings from Last 3 Encounters:  10/07/15 101  09/13/15  108  08/27/15 101        Patient Active Problem List   Diagnosis Date Noted  . Generalized anxiety disorder 10/21/2015  . Vitamin B 12 deficiency 10/21/2015  . Vitamin D deficiency 10/07/2015  . HNP (herniated nucleus pulposus), lumbar 08/26/2015  . Spinal stenosis of lumbar region 08/26/2015  . Sinus tachycardia (HCC) 08/26/2015  . Breast lump on right side at 6 o'clock position 11/05/2014  . Cervical cancer screening 08/29/2013  . HSV infection 11/01/2012  . 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 Interstitial cystitis 03/01/2011  . Dermatitis 03/01/2011  . Preventative health care 03/01/2011    Past Medical History  Diagnosis Date  . Chicken pox as a child  . 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 Interstitial cystitis 03/01/2011  . Dermatitis 03/01/2011  . Cervical cancer screening 08/29/2013  . Diarrhea 08/23/2014  . Loss of weight 08/23/2014  . Raynaud's disease   . GERD (gastroesophageal reflux disease)   . Slipped cervical disc   . Tachycardia     Since her 20's.  Resting HR of 100-110 is not unusual  . Vitamin D deficiency 10/07/2015  . Vitamin B 12 deficiency 10/21/2015    Past Surgical History  Procedure Laterality Date  . Laproscopy  2001  . Cystoscopy  2001  benign  . Lumbar laminectomy/decompression microdiscectomy Left 08/26/2015    Procedure: MICRO LUMBAR DECOMPRESSION  L5 - S1 ON LEFT;  Surgeon: Jene Every, MD;  Location: [facility];  Service: Orthopedics;  Laterality: Left;    Social History  Substance Use Topics  . Smoking status: Never Smoker   . Smokeless tobacco: Never Used  . Alcohol Use: No    Family History  Problem Relation Age of Onset  . Depression Father   . Heart disease Father   . Hypertension Father   . Hyperlipidemia Father   . Diabetes Father     type 2  . Arthritis Father   . Anxiety disorder Father   . Alcohol abuse Maternal Grandfather   . Depression Paternal Grandmother   . Alcohol abuse Paternal Grandmother   . Alcohol abuse Paternal  Grandfather   . Heart attack Paternal Grandfather   . Cancer Maternal Grandmother     skin cancer, breast cancer  . Alzheimer's disease Maternal Aunt   . Hyperlipidemia Mother   . Colon cancer Neg Hx   . Colon polyps Father   . Esophageal cancer Neg Hx   . Gallbladder disease Maternal Aunt     x4  . Gallbladder disease Maternal Uncle   . Gallbladder disease Maternal Grandmother     Allergies  Allergen Reactions  . Adhesive [Tape] Rash    Band-Aids gives pt a rash  . Sulfa Antibiotics Rash    Blisters in the mouth    Medication list has been reviewed and updated.  Current Outpatient Prescriptions on File Prior to Visit  Medication Sig Dispense Refill  . Cyanocobalamin (VITAMIN B-12) 1000 MCG SUBL Place 1 tablet (1,000 mcg total) under the tongue daily.  0  . PARoxetine (PAXIL) 10 MG tablet Take 1 tablet (10 mg total) by mouth daily. 30 tablet 3  . Vitamin D, Ergocalciferol, (DRISDOL) 50000 units CAPS capsule Take 1 capsule (50,000 Units total) by mouth every 7 (seven) days. 12 capsule 0   No current facility-administered medications on file prior to visit.    Review of Systems:  As per HPI- otherwise negative.   Physical Examination: Filed Vitals:   10/28/15 1152  BP: 113/76  Pulse: 124  Temp: 98.4 F (36.9 C)    BP Readings from Last 3 Encounters:  10/28/15 113/76  10/07/15 94/68  09/13/15 106/80    Ideal Body Weight:    GEN: WDWN, NAD, Non-toxic, A & O x 3, sometimes tearful but otherwise looks well HEENT: Atraumatic, Normocephalic. Neck supple. No masses, No LAD. Ears and Nose: No external deformity. CV: RRR- tachycardic to about 120 BPM, No M/G/R. No JVD. No thrill. No extra heart sounds. PULM: CTA B, no wheezes, crackles, rhonchi. No retractions. No resp. distress. No accessory muscle use. ABD: S, NT, ND. No rebound. No HSM.  Benign belly EXTR: No c/c/e NEURO Normal gait.  PSYCH: Normally interactive. Conversant. Not depressed or anxious appearing.   Calm demeanor.   EKG: tachycardia, non specific T wave changes likely due to rate  Called Dr. Elease Hashimoto who kindly helped me formulate a plan for her care Assessment and Plan: Atrial tachycardia (HCC) - Plan: Troponin I, PTH, Intact and Calcium, metoprolol succinate (TOPROL XL) 25 MG 24 hr tablet, propranolol (INDERAL) 10 MG tablet, LORazepam (ATIVAN) 1 MG tablet, Echocardiogram  Cardiac arrhythmia, unspecified cardiac arrhythmia type - Plan: EKG 12-Lead, Echocardiogram  Anxiety state  Here today with subacute tachycardia.  Explained to her that toprol is unlikely to cause hypotension  and that persistent tachycardia can actually be harmful to her heart.  She is willing to start treatment.  Start toprol xl 12.5 daily and prn inderal to use while the toprol takes effect.  Also refilled her ativan to use if she feels like she is having panic symptoms that are driving her tachycardia.  Cautioned regarding sedation Plan an echo next week.,  Follow up with cardiology in 2 weeks  Please start on the metoprolol once a day While this takes effect, use the 10 mg of propranolol up to every 8 hours as needed to help control your heart rate You can also use the lorazepam as needed for anxiety Let me know if these measures are not getting your pulse under 100!  We will set you up for an echocardiogram next week You will get a call from cardiology - they want to see you in about 2 weeks   Signed Abbe Amsterdam, MD

## 2015-10-29 ENCOUNTER — Emergency Department (HOSPITAL_BASED_OUTPATIENT_CLINIC_OR_DEPARTMENT_OTHER)
Admission: EM | Admit: 2015-10-29 | Discharge: 2015-10-29 | Disposition: A | Discharge status: Home / Self Care | Payer: BLUE CROSS/BLUE SHIELD | Attending: Emergency Medicine | Admitting: Emergency Medicine

## 2015-10-29 ENCOUNTER — Telehealth: Payer: Self-pay | Admitting: Family Medicine

## 2015-10-29 ENCOUNTER — Encounter (HOSPITAL_BASED_OUTPATIENT_CLINIC_OR_DEPARTMENT_OTHER): Payer: Self-pay | Admitting: *Deleted

## 2015-10-29 ENCOUNTER — Other Ambulatory Visit: Payer: Self-pay | Admitting: Family Medicine

## 2015-10-29 ENCOUNTER — Emergency Department (HOSPITAL_BASED_OUTPATIENT_CLINIC_OR_DEPARTMENT_OTHER): Payer: BLUE CROSS/BLUE SHIELD

## 2015-10-29 DIAGNOSIS — R899 Unspecified abnormal finding in specimens from other organs, systems and tissues: Secondary | ICD-10-CM

## 2015-10-29 DIAGNOSIS — R799 Abnormal finding of blood chemistry, unspecified: Secondary | ICD-10-CM | POA: Insufficient documentation

## 2015-10-29 DIAGNOSIS — Z79899 Other long term (current) drug therapy: Secondary | ICD-10-CM | POA: Insufficient documentation

## 2015-10-29 DIAGNOSIS — Z8541 Personal history of malignant neoplasm of cervix uteri: Secondary | ICD-10-CM | POA: Insufficient documentation

## 2015-10-29 DIAGNOSIS — E876 Hypokalemia: Secondary | ICD-10-CM

## 2015-10-29 LAB — TROPONIN I

## 2015-10-29 LAB — COMPREHENSIVE METABOLIC PANEL
ALBUMIN: 4.3 g/dL (ref 3.5–5.0)
ALK PHOS: 53 U/L (ref 38–126)
ALT: 15 U/L (ref 14–54)
AST: 15 U/L (ref 15–41)
Anion gap: 8 (ref 5–15)
BUN: 10 mg/dL (ref 6–20)
CALCIUM: 9.4 mg/dL (ref 8.9–10.3)
CO2: 25 mmol/L (ref 22–32)
CREATININE: 0.51 mg/dL (ref 0.44–1.00)
Chloride: 104 mmol/L (ref 101–111)
GFR calc non Af Amer: >60 mL/min (ref >60)
GLUCOSE: 87 mg/dL (ref 65–99)
Potassium: 3.5 mmol/L (ref 3.5–5.1)
SODIUM: 137 mmol/L (ref 135–145)
Total Bilirubin: 0.7 mg/dL (ref 0.3–1.2)
Total Protein: 7.4 g/dL (ref 6.5–8.1)

## 2015-10-29 LAB — CBC WITH DIFFERENTIAL/PLATELET
Basophils Absolute: 0 10*3/uL (ref 0.0–0.1)
Basophils Relative: 0 %
EOS ABS: 0.1 10*3/uL (ref 0.0–0.7)
Eosinophils Relative: 2 %
HCT: 38.7 % (ref 36.0–46.0)
HEMOGLOBIN: 13 g/dL (ref 12.0–15.0)
LYMPHS ABS: 3.1 10*3/uL (ref 0.7–4.0)
Lymphocytes Relative: 46 %
MCH: 31.8 pg (ref 26.0–34.0)
MCHC: 33.6 g/dL (ref 30.0–36.0)
MCV: 94.6 fL (ref 78.0–100.0)
Monocytes Absolute: 0.5 10*3/uL (ref 0.1–1.0)
Monocytes Relative: 7 %
NEUTROS PCT: 45 %
Neutro Abs: 3.1 10*3/uL (ref 1.7–7.7)
Platelets: 237 10*3/uL (ref 150–400)
RBC: 4.09 MIL/uL (ref 3.87–5.11)
RDW: 11.8 % (ref 11.5–15.5)
WBC: 6.8 10*3/uL (ref 4.0–10.5)

## 2015-10-29 LAB — PTH, INTACT AND CALCIUM
Calcium: 5.8 mg/dL — CL (ref 8.4–10.5)
PTH: 71 pg/mL — ABNORMAL HIGH (ref 14–64)

## 2015-10-29 LAB — D-DIMER, QUANTITATIVE (NOT AT ARMC): D DIMER QUANT: 0.34 ug{FEU}/mL (ref 0.00–0.50)

## 2015-10-29 NOTE — ED Notes (Signed)
She had blood drawn yesterday at her MD's office. They called her today and was told she has a critical calcium level 5.9.  She has a hx of tachycardia.

## 2015-10-29 NOTE — Telephone Encounter (Signed)
Critical Calcium result of 5.8

## 2015-10-29 NOTE — Telephone Encounter (Signed)
Spoke with patient she has been experiencing chest heaviness, palpitations over past 2 weeks and today had low calcium of 5.8. Due to symptoms patient agrees to present to ED for recheck and possible treatment if levels are still high.

## 2015-10-29 NOTE — ED Notes (Signed)
MD at bedside now.

## 2015-10-29 NOTE — Telephone Encounter (Signed)
So noted low calcium not high, she needs to increase calcium citrate tid but since she is symptomatic with palpitations, tachycardia and chest discomfort she needs to go to ER

## 2015-10-29 NOTE — Telephone Encounter (Signed)
Patient was informed of results.  She informed she has had what feels like an elephant on her chest for 2 week and palpitations.

## 2015-10-29 NOTE — Discharge Instructions (Signed)
Repeat calcium here was normal and in the range of your previous calciums. Follow-up with your primary care doctor as of Monday. Return for any new or worse symptoms.

## 2015-10-29 NOTE — ED Provider Notes (Signed)
CSN: [id number]     Arrival date & time 10/29/15  1657 History  By signing my name below, I, Iona Beard, attest that this documentation has been prepared under the direction and in the presence of Vanetta Mulders, MD.   Electronically Signed: Iona Beard, ED Scribe. 10/29/2015. 8:03 PM   Chief Complaint  Patient presents with  . Abnormal Lab   Patient is a 40 y.o. female presenting with palpitations. The history is provided by the patient. No language interpreter was used.  Palpitations Palpitations quality:  Regular Onset quality:  Unable to specify Duration:  3 days Timing:  Intermittent Progression:  Unchanged Chronicity:  Recurrent Worsened by:  Nothing Associated symptoms: back pain and shortness of breath   Associated symptoms: no chest pain, no cough, no nausea and no vomiting    HPI Comments: Wendy Harrell is a 40 y.o. female with PMHx of tachycardia who presents to the Emergency Department for evaluation following abnormal labs drawn by her PCP, Dr. Abner Greenspan. Pt reports ongoing tachycardia, with her most recent episode being three days ago. She is currently taking metoprolol. She states she has mild difficulty breathing and mild chest pressure during her episodes of tachycardia.  Pt states she suffers from chronic back pain and chronic migraines. No other associated symptoms noted. No worsening or alleviating factors noted. Pt denies fevers, chills, cough, rhinorrhea, abdominal pain, hematuria, dysuria, chest pain, leg swelling, rash, or any other pertinent symptoms.   Past Medical History  Diagnosis Date  . Chicken pox as a child  . 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 Interstitial cystitis 03/01/2011  . Dermatitis 03/01/2011  . Cervical cancer screening 08/29/2013  . Diarrhea 08/23/2014  . Loss of weight 08/23/2014  . Raynaud's disease   . GERD (gastroesophageal reflux disease)   . Slipped cervical disc   . Tachycardia     Since her 20's.  Resting HR of 100-110 is not  unusual  . Vitamin D deficiency 10/07/2015  . Vitamin B 12 deficiency 10/21/2015   Past Surgical History  Procedure Laterality Date  . Laproscopy  2001  . Cystoscopy  2001    benign  . Lumbar laminectomy/decompression microdiscectomy Left 08/26/2015    Procedure: MICRO LUMBAR DECOMPRESSION  L5 - S1 ON LEFT;  Surgeon: Jene Every, MD;  Location: [facility];  Service: Orthopedics;  Laterality: Left;   Family History  Problem Relation Age of Onset  . Depression Father   . Heart disease Father   . Hypertension Father   . Hyperlipidemia Father   . Diabetes Father     type 2  . Arthritis Father   . Anxiety disorder Father   . Alcohol abuse Maternal Grandfather   . Depression Paternal Grandmother   . Alcohol abuse Paternal Grandmother   . Alcohol abuse Paternal Grandfather   . Heart attack Paternal Grandfather   . Cancer Maternal Grandmother     skin cancer, breast cancer  . Alzheimer's disease Maternal Aunt   . Hyperlipidemia Mother   . Colon cancer Neg Hx   . Colon polyps Father   . Esophageal cancer Neg Hx   . Gallbladder disease Maternal Aunt     x4  . Gallbladder disease Maternal Uncle   . Gallbladder disease Maternal Grandmother    Social History  Substance Use Topics  . Smoking status: Never Smoker   . Smokeless tobacco: Never Used  . Alcohol Use: No   OB History    No data available     Review of Systems  Constitutional: Negative for fever and chills.  HENT: Negative for congestion and rhinorrhea.   Eyes: Negative for visual disturbance.  Respiratory: Positive for shortness of breath. Negative for cough.   Cardiovascular: Positive for palpitations. Negative for chest pain and leg swelling.  Gastrointestinal: Negative for nausea, vomiting, abdominal pain and diarrhea.  Genitourinary: Negative for dysuria and hematuria.  Musculoskeletal: Positive for back pain.  Skin: Negative for rash.  Allergic/Immunologic: Negative for immunocompromised state.  Neurological:  Positive for headaches.    Allergies  Adhesive and Sulfa antibiotics  Home Medications   Prior to Admission medications   Medication Sig Start Date End Date Taking? Authorizing Provider  Cyanocobalamin (VITAMIN B-12) 1000 MCG SUBL Place 1 tablet (1,000 mcg total) under the tongue daily. 10/21/15   Bradd CanaryStacey A Blyth, MD  LORazepam (ATIVAN) 1 MG tablet Take 1/2 or 1 every 8 hours as needed for anxiety or tachycardia 10/28/15   Gwenlyn FoundJessica C Copland, MD  metoprolol succinate (TOPROL XL) 25 MG 24 hr tablet Take 0.5 tablets (12.5 mg total) by mouth daily. 10/28/15   Gwenlyn FoundJessica C Copland, MD  PARoxetine (PAXIL) 10 MG tablet Take 1 tablet (10 mg total) by mouth daily. 10/21/15   Bradd CanaryStacey A Blyth, MD  propranolol (INDERAL) 10 MG tablet Take up to every 8 hours as needed for tachycardia 10/28/15   Gwenlyn FoundJessica C Copland, MD  Vitamin D, Ergocalciferol, (DRISDOL) 50000 units CAPS capsule Take 1 capsule (50,000 Units total) by mouth every 7 (seven) days. 10/11/15   Bradd CanaryStacey A Blyth, MD   BP 133/97 mmHg  Pulse 95  Temp(Src) 98.5 F (36.9 C) (Oral)  Resp 18  Ht 5\' 1"  (1.549 m)  Wt 121 lb (54.885 kg)  BMI 22.87 kg/m2  SpO2 100%  LMP 10/11/2015 Physical Exam  Constitutional: She is oriented to person, place, and time. She appears well-developed and well-nourished. No distress.  HENT:  Head: Normocephalic and atraumatic.  Mouth/Throat: Mucous membranes are not dry.  Eyes: EOM are normal.  Neck: Normal range of motion.  Cardiovascular: Normal rate, regular rhythm and normal heart sounds.  Exam reveals no gallop.   No murmur heard. Pulmonary/Chest: Effort normal and breath sounds normal. No respiratory distress. She has no wheezes. She has no rales.  Abdominal: Soft. Bowel sounds are normal. She exhibits no distension. There is no tenderness.  Musculoskeletal: Normal range of motion. She exhibits no edema.  Neurological: She is alert and oriented to person, place, and time. No cranial nerve deficit.  Skin: Skin is warm  and dry.  Psychiatric: She has a normal mood and affect. Judgment normal.  Nursing note and vitals reviewed.   ED Course  Procedures (including critical care time) DIAGNOSTIC STUDIES: Oxygen Saturation is 100% on RA, normal by my interpretation.    COORDINATION OF CARE: 8:03 PM Discussed treatment plan with pt at bedside and pt agreed to plan.   Labs Review Labs Reviewed  CBC WITH DIFFERENTIAL/PLATELET  COMPREHENSIVE METABOLIC PANEL  TROPONIN I  D-DIMER, QUANTITATIVE (NOT AT [facility])    Imaging Review Dg Chest 2 View  10/29/2015  CLINICAL DATA:  Chest tightness EXAM: CHEST  2 VIEW COMPARISON:  None. FINDINGS: Normal heart size. Normal mediastinal contour. No pneumothorax. No pleural effusion. Lungs appear clear, with no acute consolidative airspace disease and no pulmonary edema. IMPRESSION: No active cardiopulmonary disease. Electronically Signed   By: Delbert PhenixJason A Poff M.D.   On: 10/29/2015 19:03   I have personally reviewed and evaluated these images and lab results as part of my  medical decision-making.   EKG Interpretation   Date/Time:  Friday Oct 29 2015 17:11:49 EDT Ventricular Rate:  96 PR Interval:  138 QRS Duration: 80 QT Interval:  326 QTC Calculation: 411 R Axis:   71 Text Interpretation:  Normal sinus rhythm Normal ECG Confirmed by  Shawntina Diffee  MD, Loralyn Rachel ([id number]) on 10/29/2015 5:51:25 PM      MDM   Final diagnoses:  Abnormal laboratory test    Patient with workup for tachycardia by her primary care doctor. Patient had a calcium level that was ordered and it was critically low. Patient referred in here for abnormal lab. However repeat calcium here is normal and compared to her previous ones this month and last month. Patient nontoxic no acute distress no significant symptoms. Patient stable for discharge home and follow-up with her regular doctor.   I personally performed the services described in this documentation, which was scribed in my presence. The recorded  information has been reviewed and is accurate.        Vanetta Mulders, MD 10/29/15 2004

## 2015-10-29 NOTE — Telephone Encounter (Signed)
make sure she is not taking any and have her minimize it in her diet this weekend. make sure she has not had palpitations or muscle cramps. if so to ER if not recheck CMP on Monday, if symptoms over weekend to ER   Called the patient informed of PCP above instructions.

## 2015-10-29 NOTE — Telephone Encounter (Signed)
Patient informed of PCP instructions. PCP did actually speak to the patient and informed of instructions.

## 2015-10-29 NOTE — ED Notes (Signed)
Pt verbalizes understanding of d/c instructions and denies any further needs at this time. 

## 2015-11-01 ENCOUNTER — Other Ambulatory Visit: Payer: BLUE CROSS/BLUE SHIELD

## 2015-11-01 ENCOUNTER — Telehealth: Payer: Self-pay | Admitting: Family Medicine

## 2015-11-01 ENCOUNTER — Encounter: Payer: Self-pay | Admitting: Family Medicine

## 2015-11-01 DIAGNOSIS — R7989 Other specified abnormal findings of blood chemistry: Secondary | ICD-10-CM

## 2015-11-01 NOTE — Telephone Encounter (Signed)
Have spoken to the patient already this morning and sent msg. To PCP

## 2015-11-01 NOTE — Telephone Encounter (Signed)
[name] name: Self  Can be reached: [phone]   Reason for call: Patient request call back to see if she still needs to come in for labs today because she was seen and the ER and determined that her lab values were false/positive. States you can leave a message on her voicemail

## 2015-11-04 ENCOUNTER — Encounter: Payer: Self-pay | Admitting: Family Medicine

## 2015-11-04 ENCOUNTER — Telehealth: Payer: Self-pay | Admitting: Family Medicine

## 2015-11-04 NOTE — Telephone Encounter (Signed)
Insurance denied Echo. Please advise.

## 2015-11-04 NOTE — Telephone Encounter (Signed)
I contacted insurance and we can appeal, just will need MD to call MD review @ [phone] Insurance ID # [id number]

## 2015-11-04 NOTE — Telephone Encounter (Signed)
Ok- she needs the echo due to prolonged tachycardia. Cardiology wanted to get this done before she sees them next week.  We will need to appeal JC

## 2015-11-09 ENCOUNTER — Encounter: Payer: Self-pay | Admitting: Physician Assistant

## 2015-11-09 NOTE — Progress Notes (Addendum)
Cardiology Office Note    Date:  11/10/2015  ID:  Wendy Harrell, DOB 1976/04/18, MRN [id number] PCP:  Danise Edge, MD  Cardiologist:  Nahser   Chief Complaint: f/u palpitations  History of Present Illness:  Wendy Harrell is a 40 y.o. female with history of sinus tachycardia, Raynaud's disease, GERD who presents for f/u of elevated HR. She has been followed by Dr. Elease Hashimoto for this.   She has history of "tachycardia" dating back to her 66's but states she has not been symptomatic with this until several recent episodes. She was tried on metoprolol but developed significant fatigue so went off of it. She saw Dr. Elease Hashimoto at [facility] 09/13/15 at which time she was started on Toprol 12.5mg  nightly. She was afraid to start it around the same time she had back surgery so she held off. In the middle of the night 5/17-5/18 she awoke with sensation of chest tightness with elevated HR and "panic attack" feeling. This essentially lasted all day. When she saw her PCP on 5/18 symptoms were still present and EKG showed sinus tach 114bpm. PCP did labs which resulted a critically low calcium level thus she was sent to the ER. The low calcium level proved to be a lab error. Labs 5/19: d-dimer negative, troponin negative, CMET and CBC wnl. Multiple prior TSHs were negative. PCP ordered 2D echo but the patient says it was denied because she has not had a documented arrhythmia. She decided to start the Toprol and has felt somewhat better since but is still concerned about periodically elevated HR. She has not had any CP or SOB with exertion. She is under stress because her daughter has a lot of anxiety.   Past Medical History  Diagnosis Date  . Chicken pox as a child  . 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 Interstitial cystitis 03/01/2011  . Dermatitis 03/01/2011  . Cervical cancer screening 08/29/2013  . Diarrhea 08/23/2014  . Loss of weight 08/23/2014  . Raynaud's disease   . GERD (gastroesophageal reflux disease)   .  Slipped cervical disc   . 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  Resting HR of 100-110 is not unusual  . Vitamin D deficiency 10/07/2015  . Vitamin B 12 deficiency 10/21/2015    Past Surgical History  Procedure Laterality Date  . Laproscopy  2001  . Cystoscopy  2001    benign  . Lumbar laminectomy/decompression microdiscectomy Left 08/26/2015    Procedure: MICRO LUMBAR DECOMPRESSION  L5 - S1 ON LEFT;  Surgeon: Jene Every, MD;  Location: [facility];  Service: Orthopedics;  Laterality: Left;    Current Medications: Outpatient Prescriptions Prior to Visit  Medication Sig Dispense Refill  . metoprolol succinate (TOPROL XL) 25 MG 24 hr tablet Take 0.5 tablets (12.5 mg total) by mouth daily. 45 tablet 3  . PARoxetine (PAXIL) 10 MG tablet Take 1 tablet (10 mg total) by mouth daily. 30 tablet 3  . propranolol (INDERAL) 10 MG tablet Take up to every 8 hours as needed for tachycardia 40 tablet 0  . Cyanocobalamin (VITAMIN B-12) 1000 MCG SUBL Place 1 tablet (1,000 mcg total) under the tongue daily.  0  . LORazepam (ATIVAN) 1 MG tablet Take 1/2 or 1 every 8 hours as needed for anxiety or tachycardia 30 tablet 0  . Vitamin D, Ergocalciferol, (DRISDOL) 50000 units CAPS capsule Take 1 capsule (50,000 Units total) by mouth every 7 (seven) days. 12 capsule 0   No facility-administered medications prior to visit.  Allergies:   Robaxin; Adhesive; and Sulfa antibiotics   Social History   Social History  . Marital Status: Married    Spouse Name: N/A  . Number of Children: 1  . Years of Education: N/A   Occupational History  . 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 Smoking status: Never Smoker   . Smokeless tobacco: Never Used  . Alcohol Use: No  . Drug Use: No  . Sexual Activity:    Partners: Male    Birth Control/ Protection: Surgical   Other Topics Concern  . None   Social History Narrative     Family History:  The patient's family history includes Alcohol abuse in  her maternal grandfather, paternal grandfather, and paternal grandmother; Alzheimer's disease in her maternal aunt; Anxiety disorder in her father; Arthritis in her father; Cancer in her maternal grandmother; Colon polyps in her father; Depression in her father and paternal grandmother; Diabetes in her father; Gallbladder disease in her maternal aunt, maternal grandmother, and maternal uncle; Heart attack in her paternal grandfather; Heart disease in her father; Hyperlipidemia in her father and mother; Hypertension in her father. There is no history of Colon cancer or Esophageal cancer.   ROS:   Please see the history of present illness. All other systems are reviewed and otherwise negative.    PHYSICAL EXAM:   VS:  BP 104/68 mmHg  Pulse 83  Ht 5\' 1"  (1.549 m)  Wt 120 lb 6.4 oz (54.613 kg)  BMI 22.76 kg/m2  LMP 10/11/2015  BMI: Body mass index is 22.76 kg/(m^2). GEN: Well nourished, well developed WF in no acute distress HEENT: normocephalic, atraumatic Neck: no JVD, carotid bruits, or masses Cardiac: RRR; no murmurs, rubs, or gallops, no edema  Respiratory:  clear to auscultation bilaterally, normal work of breathing GI: soft, nontender, nondistended, + BS MS: no deformity or atrophy Skin: warm and dry, no rash Neuro:  Alert and Oriented x 3, Strength and sensation are intact, follows commands Psych: euthymic mood, full affect  Wt Readings from Last 3 Encounters:  11/10/15 120 lb 6.4 oz (54.613 kg)  10/29/15 121 lb (54.885 kg)  10/28/15 122 lb 12.8 oz (55.702 kg)      Studies/Labs Reviewed:   EKG:  EKG was ordered today and personally reviewed by me. The EKG ordered today demonstrates NSR 83bpm, no acute ST-T changes  Recent Labs: 10/07/2015: TSH 1.33 10/29/2015: ALT 15; BUN 10; Creatinine, Ser 0.51; Hemoglobin 13.0; Platelets 237; Potassium 3.5; Sodium 137   Lipid Panel    Component Value Date/Time   CHOL 190 09/02/2014 0956   TRIG 53.0 09/02/2014 0956   HDL 58.10  09/02/2014 0956   CHOLHDL 3 09/02/2014 0956   VLDL 10.6 09/02/2014 0956   LDLCALC 121* 09/02/2014 0956    Additional studies/ records that were reviewed today include: Summarized above.    ASSESSMENT & PLAN:   1. Palpitations - she has h/o sinus tach but states she was previously asymptomatic with this, until she had 2 nights where she had associated panic sensation. She does admit to increased stress as her daughter has a lot of anxiety. When she saw her PCP she was in sinus tach but given description of "panic attack," I cannot exclude a possible breakthrough of SVT. Will arrange 4 week event monitor to assess for any other arrhythmias and follow HR excursion. She wants to continue BB during this time because it has helped her symptoms during this stressful time in her life. Will  also see if we can successfully order echocardiogram - she said that the one ordered by her PCP was not approved under the diagnosis submitted. I believe this is important to r/o structural heart disease in the setting of the above symptoms and will help guide therapy. LV function is currently unknown. 2. Sinus tach - no obvious metabolic cause at present time. Could consider referral to EP for inappropriate sinus tach if no other arrhythmias are seen. 3. Chest tightness - in the setting of #1. See above.  Disposition: F/u with me or Dr. Elease HashimotoNahser in 6 weeks.   Medication Adjustments/Labs and Tests Ordered: Current medicines are reviewed at length with the patient today.  Concerns regarding medicines are outlined above. Medication changes, Labs and Tests ordered today are listed above.  Thomasene MohairSigned, Dayna Dunn PA-C  11/10/2015 11:30 AM    [facility] [address], [place], [place]  [address] Phone: [phone]; Fax: [phone]) [phone]

## 2015-11-10 ENCOUNTER — Encounter: Payer: Self-pay | Admitting: Physician Assistant

## 2015-11-10 ENCOUNTER — Ambulatory Visit (INDEPENDENT_AMBULATORY_CARE_PROVIDER_SITE_OTHER): Payer: BLUE CROSS/BLUE SHIELD

## 2015-11-10 ENCOUNTER — Encounter: Payer: Self-pay | Admitting: Family Medicine

## 2015-11-10 ENCOUNTER — Ambulatory Visit (INDEPENDENT_AMBULATORY_CARE_PROVIDER_SITE_OTHER): Payer: BLUE CROSS/BLUE SHIELD | Admitting: Physician Assistant

## 2015-11-10 VITALS — BP 104/68 | HR 83 | Ht 61.0 in | Wt 120.4 lb

## 2015-11-10 DIAGNOSIS — R002 Palpitations: Secondary | ICD-10-CM

## 2015-11-10 DIAGNOSIS — R000 Tachycardia, unspecified: Secondary | ICD-10-CM

## 2015-11-10 DIAGNOSIS — R0789 Other chest pain: Secondary | ICD-10-CM | POA: Diagnosis not present

## 2015-11-10 NOTE — Patient Instructions (Addendum)
Medication Instructions:  Your physician recommends that you continue on your current medications as directed. Please refer to the Current Medication list given to you today.  Labwork: None ordered  Testing/Procedures: Your physician has requested that you have an echocardiogram. Echocardiography is a painless test that uses sound waves to create images of your heart. It provides your doctor with information about the size and shape of your heart and how well your heart's chambers and valves are working. This procedure takes approximately one hour. There are no restrictions for this procedure.  Your physician has recommended that you wear an event monitor. Event monitors are medical devices that record the heart's electrical activity. Doctors most often us these monitors to diagnose arrhythmias. Arrhythmias are problems with the speed or rhythm of the heartbeat. The monitor is a small, portable device. You can wear one while you do your normal daily activities. This is usually used to diagnose what is causing palpitations/syncope (passing out).    Follow-Up: Your physician recommends that you schedule a follow-up appointment in: 4-6 WEEKS WITH DR. Elease HashimotoNAHSER OR DAYNA DUNN, PA-C   Any Other Special Instructions Will Be Listed Below (If Applicable). Echocardiogram An echocardiogram, or echocardiography, uses sound waves (ultrasound) to produce an image of your heart. The echocardiogram is simple, painless, obtained within a short period of time, and offers valuable information to your health care provider. The images from an echocardiogram can provide information such as:  Evidence of coronary artery disease (CAD).  Heart size.  Heart muscle function.  Heart valve function.  Aneurysm detection.  Evidence of a past heart attack.  Fluid buildup around the heart.  Heart muscle thickening.  Assess heart valve function. LET [facility] PROVID[facility] KNOW ABOUT:  Any allergies you  have.  All medicines you are taking, including vitamins, herbs, eye drops, creams, and over-the-counter medicines.  Previous problems you or members of your family have had with the use of anesthetics.  Any blood disorders you have.  Previous surgeries you have had.  Medical conditions you have.  Possibility of pregnancy, if this applies. BEFORE THE PROCEDURE  No special preparation is needed. Eat and drink normally.  PROCEDURE   In order to produce an image of your heart, gel will be applied to your chest and a wand-like tool (transducer) will be moved over your chest. The gel will help transmit the sound waves from the transducer. The sound waves will harmlessly bounce off your heart to allow the heart images to be captured in real-time motion. These images will then be recorded.  You may need an IV to receive a medicine that improves the quality of the pictures. AFTER THE PROCEDURE You may return to your normal schedule including diet, activities, and medicines, unless your health care provider tells you otherwise.   This information is not intended to replace advice given to you by your health care provider. Make sure you discuss any questions you have with your health care provider.   Document Released: 05/26/2000 Document Revised: 06/19/2014 Document Reviewed: 02/03/2013 Elsevier Interactive Patient Education 2016 Elsevier Inc.   Cardiac Event Monitoring A cardiac event monitor is a small recording device used to help detect abnormal heart rhythms (arrhythmias). The monitor is used to record heart rhythm when noticeable symptoms such as the following occur:  Fast heartbeats (palpitations), such as heart racing or fluttering.  Dizziness.  Fainting or light-headedness.  Unexplained weakness. The monitor is wired to two electrodes placed on your chest. Electrodes are flat, sticky  disks that attach to your skin. The monitor can be worn for up to 30 days. You will wear the  monitor at all times, except when bathing.  HOW TO USE YOUR CARDIAC EVENT MONITOR A technician will prepare your chest for the electrode placement. The technician will show you how to place the electrodes, how to work the monitor, and how to replace the batteries. Take time to practice using the monitor before you leave the office. Make sure you understand how to send the information from the monitor to your health care provider. This requires a telephone with a landline, not a cell phone. You need to:  Wear your monitor at all times, except when you are in water:  Do not get the monitor wet.  Take the monitor off when bathing. Do not swim or use a hot tub with it on.  Keep your skin clean. Do not put body lotion or moisturizer on your chest.  Change the electrodes daily or any time they stop sticking to your skin. You might need to use tape to keep them on.  It is possible that your skin under the electrodes could become irritated. To keep this from happening, try to put the electrodes in slightly different places on your chest. However, they must remain in the area under your left breast and in the upper right section of your chest.  Make sure the monitor is safely clipped to your clothing or in a location close to your body that your health care provider recommends.  Press the button to record when you feel symptoms of heart trouble, such as dizziness, weakness, light-headedness, palpitations, thumping, shortness of breath, unexplained weakness, or a fluttering or racing heart. The monitor is always on and records what happened slightly before you pressed the button, so do not worry about being too late to get good information.  Keep a diary of your activities, such as walking, doing chores, and taking medicine. It is especially important to note what you were doing when you pushed the button to record your symptoms. This will help your health care provider determine what might be contributing  to your symptoms. The information stored in your monitor will be reviewed by your health care provider alongside your diary entries.  Send the recorded information as recommended by your health care provider. It is important to understand that it will take some time for your health care provider to process the results.  Change the batteries as recommended by your health care provider. SEEK IMMEDIATE MEDICAL CARE IF:   You have chest pain.  You have extreme difficulty breathing or shortness of breath.  You develop a very fast heartbeat that persists.  You develop dizziness that does not go away.  You faint or constantly feel you are about to faint.   This information is not intended to replace advice given to you by your health care provider. Make sure you discuss any questions you have with your health care provider.   Document Released: 03/07/2008 Document Revised: 06/19/2014 Document Reviewed: 11/25/2012 Elsevier Interactive Patient Education [employer]  If you need a refill on your cardiac medications before your next appointment, please call your pharmacy.

## 2015-11-10 NOTE — Telephone Encounter (Signed)
Have tried to get this taken care of- however pt let me know that cardiology plans to order so we can d/c appeal process

## 2015-11-15 ENCOUNTER — Telehealth: Payer: Self-pay | Admitting: Cardiovascular Disease

## 2015-11-15 ENCOUNTER — Encounter: Payer: Self-pay | Admitting: Family Medicine

## 2015-11-15 NOTE — Telephone Encounter (Signed)
Left message for patient to call back  

## 2015-11-15 NOTE — Telephone Encounter (Signed)
Pt has heart monitor and when she went to snap it on Saturday something in her "snapped" and she has been having pain in that sight ever since, she called the company and they told her to not wear it and call today pls call  [phone]

## 2015-11-16 ENCOUNTER — Other Ambulatory Visit: Payer: Self-pay

## 2015-11-16 ENCOUNTER — Ambulatory Visit (HOSPITAL_COMMUNITY): Payer: BLUE CROSS/BLUE SHIELD | Attending: Cardiology

## 2015-11-16 DIAGNOSIS — R0789 Other chest pain: Secondary | ICD-10-CM | POA: Insufficient documentation

## 2015-11-16 DIAGNOSIS — R002 Palpitations: Secondary | ICD-10-CM | POA: Insufficient documentation

## 2015-11-16 DIAGNOSIS — I358 Other nonrheumatic aortic valve disorders: Secondary | ICD-10-CM | POA: Insufficient documentation

## 2015-11-16 DIAGNOSIS — I340 Nonrheumatic mitral (valve) insufficiency: Secondary | ICD-10-CM | POA: Diagnosis not present

## 2015-11-16 DIAGNOSIS — I071 Rheumatic tricuspid insufficiency: Secondary | ICD-10-CM | POA: Diagnosis not present

## 2015-11-18 ENCOUNTER — Telehealth: Payer: Self-pay | Admitting: Cardiovascular Disease

## 2015-11-18 NOTE — Telephone Encounter (Signed)
New Message  Pt requested to speak w/ RN- stated that the monitor she is wearing prevents her from sleeping- pt stated that she has been taking it off before bed- wanted to inform RN. ALSO, Pt c/o feeling her pulse in her face- stated it may have to do with her Metoprolol. Please call back and discuss.

## 2015-11-18 NOTE — Telephone Encounter (Signed)
Spoke with patient who states she is unable to wear event monitor at night due to it presses on her chest where she has musculoskeletal pain.  She states she is wearing it throughout the day.  I advised her that the best results will be obtained by wearing the monitor as much as possible but that interference of sleep will also likely cause increase in symptoms.  I reviewed her chart and discussed her low normal K+ level at 3.5.  I advised that Dr. Elease HashimotoNahser encourages patients with palpitations to have a slightly higher K+ blood level and advised her to increase dietary intake of potassium, including V8 juice, bananas, and tomotoes.  She states she feels her heart beat in her face, especially at night when she is still.  I advised her to take a propranolol prior to bed time to see if this helps.  We discussed normal heart rate and BP ranges as she states her normal BP is usually low 100's systolic and 60's diastolic and normal heart rate for her is in 70's bpm.  I advised her to monitor for symptoms of fatigue, dizziness, and/or light-headedness when she takes extra propranolol.  I advised her to call back with questions or concerns.  She verbalized understanding and agreement with plan of care.

## 2015-11-18 NOTE — Telephone Encounter (Signed)
See telephone encounter dated 11/18/15

## 2015-11-20 ENCOUNTER — Telehealth: Payer: Self-pay | Admitting: Physician Assistant

## 2015-11-20 NOTE — Telephone Encounter (Signed)
Wendy Harrell is a 40 y.o. female who has been seen in our office for sinus tachycardia. She has been treated with metoprolol. She has significant insomnia. She was placed on Paxil and Ativan by her PCP. She did not like the way these drugs were making her feel and she stopped them several days ago. She feels that her tachycardia has not responded to metoprolol like it was previously. She took a dose of propranolol last night when she could not sleep. This only made her dizzy. She is very emotional and has a lot of anxiety. She has used amitriptyline in the past with success in regards to sleep. I have advised her to take a dose of Ativan 0.5 mg now.  She can take a dose of metoprolol 25 mg instead of 12.5 mg tonight if she still feels significant tachycardia.  It is possible that stopping Paxil suddenly may have contributed to some of her symptoms. I have advised her to call her primary care physician to discuss her antidepressant medications and sleep aids.   She agrees with this plan. Wendy NewcomerScott Maliyah Willets, PA-C   11/20/2015 2:33 PM

## 2015-11-23 ENCOUNTER — Other Ambulatory Visit: Payer: Self-pay | Admitting: Family Medicine

## 2015-11-23 ENCOUNTER — Telehealth: Payer: Self-pay | Admitting: Cardiovascular Disease

## 2015-11-23 ENCOUNTER — Encounter: Payer: Self-pay | Admitting: Medical

## 2015-11-23 ENCOUNTER — Ambulatory Visit (INDEPENDENT_AMBULATORY_CARE_PROVIDER_SITE_OTHER): Payer: BLUE CROSS/BLUE SHIELD | Admitting: Medical

## 2015-11-23 VITALS — BP 108/55 | HR 99 | Temp 98.6°F | Ht 61.0 in | Wt 120.2 lb

## 2015-11-23 DIAGNOSIS — I471 Supraventricular tachycardia: Secondary | ICD-10-CM

## 2015-11-23 DIAGNOSIS — R000 Tachycardia, unspecified: Secondary | ICD-10-CM | POA: Diagnosis not present

## 2015-11-23 DIAGNOSIS — F411 Generalized anxiety disorder: Secondary | ICD-10-CM

## 2015-11-23 DIAGNOSIS — I4719 Other supraventricular tachycardia: Secondary | ICD-10-CM

## 2015-11-23 MED ORDER — AMITRIPTYLINE HCL 25 MG PO TABS: 25.0000 mg | ORAL_TABLET | Freq: Every day | ORAL | Status: DC

## 2015-11-23 NOTE — Telephone Encounter (Signed)
Spoke with pt. She reports she has been taking lopressor 25 mg every night since speaking with Tereso NewcomerScott Weaver, PA on 6/10.  Also taking Ativan. She has been sleeping better. Still off Paxil.  She reports heart rate is better now than it was on 6/10.  At that time it was 120 for 3-4 hours. She was experiencing 5 days of insomnia at that time.  Now heart rate runs around 100-110 at night.  Currently it is 80. She feels dose of lopressor 25 mg has been helping her heart rate.  She is asking if she should continue this dose.  She is also wondering why she used to be OK on 12.5 mg dose and now needs 25 mg daily.  She is asking if this could be related to stopping Paxil.  Will forward to Dr. Elease HashimotoNahser for review and recommendations.

## 2015-11-23 NOTE — Progress Notes (Signed)
Pre visit review using our clinic tool,if applicable. No additional management support is needed unless otherwise documented below in the visit note.  

## 2015-11-23 NOTE — Telephone Encounter (Signed)
Pt calling regarding heart rate-was told to take Adavan and Metoprolol 25mg  at bedtime-rate still about 100-is this ok-pls call [phone]

## 2015-11-23 NOTE — Patient Instructions (Addendum)
For your tachycardia use toprol xl 25 mg a day. See cardiologist end of June.  Your heart is being monitored now. If you get subjective tachycardia then call your cardiologist for advisement.  For your mood, insomnia and anxiety you can start elavil at night. You can try to taper of ativan as you desire. But use 1 mg tonight, then .5 mg at night next 3 nights, then .25 mg for 3 nights. Keep in mind that if you need low dose that is ok and we could write you low dose to take each night.   Follow up in 2 weeks or as needed

## 2015-11-23 NOTE — Progress Notes (Signed)
Subjective:    Patient ID: Wendy Harrell, female    DOB: June 20, 1975, 40 y.o.   MRN: [id number]  HPI  Pt mood recently decreased mood and anxious. And some occasional tachcyardia.(most recently pulse up to 125 around 11-20-2015). But mostly has been controlled since [place] of metoprolol. Pt was told by cardiologist to take 25 mg toprol. This was increase from 12.5 mg a day.   Pt states states she has history of rapid pulse. Always close to 100.   One time pt states her pulse went to 160 randomly when she was in hospital after surgery for her back in March. She states that was after she came out of surgery. She denies feeling anxious at that time. Also she states was not in pain. She had to get iv lopressor.  Pt states that cardiologist wanted to discharge her on toprol xl in march. Pt was not compliant back then. She only monitored her pulse. Her pulse never went over 105. Cardiologist advised her to take toprol. Pt declined and did not take med.   Pt states Mid May she started waking up in middle of the night. She saw Dr Patsy Lager and she got advise from cardiologist. Pt restarted toprol daily, proponal(prn) and ativan at night.  Then later she started paxil rx that Dr. Abner Greenspan had written. She started the medicine but then she stopped 2 wks later. Also she stopped the ativan around the same time. Trouble sleeping around that time.  Pt does not want to take propanol. She wants to use elavil which per her report cardiologist stated she could and Dr Abner Greenspan gave her rx. She just has not taken it yet.Also she expresses no longer wants to take paxil. And eventually wants to stop ativan.  Pt has appointment with cardiologist December 10, 2015.   Pt cries and discusses surgery and recovery was difficult. Pt had difficulty with residual weakness and and has some limp.  Pt has heart monitor on presently.      Review of Systems  Constitutional: Negative for fever, chills and fatigue.  Respiratory:  Negative for cough and chest tightness.   Cardiovascular: Positive for palpitations. Negative for chest pain.  Gastrointestinal: Negative for abdominal pain.  Musculoskeletal: Negative for back pain.  Neurological: Negative for dizziness and numbness.  Hematological: Negative for adenopathy. Does not bruise/bleed easily.  Psychiatric/Behavioral: Negative for suicidal ideas, behavioral problems and confusion. The patient is nervous/anxious.     Past Medical History  Diagnosis Date  . Chicken pox as a child  . 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 Interstitial cystitis 03/01/2011  . Dermatitis 03/01/2011  . Cervical cancer screening 08/29/2013  . Diarrhea 08/23/2014  . Loss of weight 08/23/2014  . Raynaud's disease   . GERD (gastroesophageal reflux disease)   . Slipped cervical disc   . 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  Resting HR of 100-110 is not unusual  . Vitamin D deficiency 10/07/2015  . Vitamin B 12 deficiency 10/21/2015     Social History   Social History  . Marital Status: Married    Spouse Name: N/A  . Number of Children: 1  . Years of Education: N/A   Occupational History  . 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 Smoking status: Never Smoker   . Smokeless tobacco: Never Used  . Alcohol Use: No  . Drug Use: No  . Sexual Activity:    Partners: Male    Birth Control/ Protection: Surgical  Other Topics Concern  . Not on file   Social History Narrative    Past Surgical History  Procedure Laterality Date  . Laproscopy  2001  . Cystoscopy  2001    benign  . Lumbar laminectomy/decompression microdiscectomy Left 08/26/2015    Procedure: MICRO LUMBAR DECOMPRESSION  L5 - S1 ON LEFT;  Surgeon: Jene EveryJeffrey Beane, MD;  Location: [facility];  Service: Orthopedics;  Laterality: Left;    Family History  Problem Relation Age of Onset  . Depression Father   . Heart disease Father   . Hypertension Father   . Hyperlipidemia Father   . Diabetes Father     type 2  .  Arthritis Father   . Anxiety disorder Father   . Alcohol abuse Maternal Grandfather   . Depression Paternal Grandmother   . Alcohol abuse Paternal Grandmother   . Alcohol abuse Paternal Grandfather   . Heart attack Paternal Grandfather   . Cancer Maternal Grandmother     skin cancer, breast cancer  . Alzheimer's disease Maternal Aunt   . Hyperlipidemia Mother   . Colon cancer Neg Hx   . Colon polyps Father   . Esophageal cancer Neg Hx   . Gallbladder disease Maternal Aunt     x4  . Gallbladder disease Maternal Uncle   . Gallbladder disease Maternal Grandmother     Allergies  Allergen Reactions  . Robaxin [Methocarbamol] Other (See Comments)    syncope  . Adhesive [Tape] Rash    Band-Aids gives pt a rash  . Sulfa Antibiotics Rash    Blisters in the mouth    Current Outpatient Prescriptions on File Prior to Visit  Medication Sig Dispense Refill  . metoprolol succinate (TOPROL XL) 25 MG 24 hr tablet Take 0.5 tablets (12.5 mg total) by mouth daily. 45 tablet 3  . amitriptyline (ELAVIL) 25 MG tablet Take 1 tablet (25 mg total) by mouth at bedtime. (Patient not taking: Reported on 11/23/2015) 15 tablet 0  . LORazepam (ATIVAN) 1 MG tablet Take 1 mg by mouth every 8 (eight) hours.    Marland Kitchen. PARoxetine (PAXIL) 10 MG tablet Take 1 tablet (10 mg total) by mouth daily. (Patient not taking: Reported on 11/23/2015) 30 tablet 3  . propranolol (INDERAL) 10 MG tablet Take 10 mg by mouth 3 (three) times daily. Reported on 11/23/2015     No current facility-administered medications on file prior to visit.    BP 108/55 mmHg  Pulse 105  Temp(Src) 98.6 F (37 C) (Oral)  Ht 5\' 1"  (1.549 m)  Wt 120 lb 3.2 oz (54.522 kg)  BMI 22.72 kg/m2  SpO2 100%  LMP 11/02/2015       Objective:   Physical Exam  General Mental Status- Alert. General Appearance- Not in acute distress. Nervous and crying intermittently discussing her concerns.  Skin General: Color- Normal Color. Moisture- Normal  Moisture.  Neck Carotid Arteries- Normal color. Moisture- Normal Moisture. No carotid bruits. No JVD.  Chest and Lung Exam Auscultation: Breath Sounds:-Normal.  Cardiovascular Auscultation:Rythm- Regular. Murmurs & Other Heart Sounds:Auscultation of the heart reveals- No Murmurs.  Abdomen Inspection:-Inspeection Normal. Palpation/Percussion:Note:No mass. Palpation and Percussion of the abdomen reveal- Non Tender, Non Distended + BS, no rebound or guarding.   Neurologic Cranial Nerve exam:- CN III-XII intact(No nystagmus), symmetric smile. Strength:- 5/5 equal and symmetric strength both upper and lower extremities.      Assessment & Plan:  For your tachycardia use toprol xl 25 mg a day. See cardiologist end of June.  Your heart is being monitored now. If you get subjective tachycardia then call your cardiologist  for advisement.  For your mood, insomnia and anxiety you can start elavil at night. You can try to taper of ativan as you desire. But use 1 mg tonight, then .5 mg at night next 3 nights, then .25 mg for 3 nights. Keep in mind that if you need low dose that is ok and we could write you low dose to take each night.   Follow up in 2 weeks or as needed Hazelyn Kallen, Ramon Dredge, [employer]

## 2015-11-24 ENCOUNTER — Telehealth: Payer: Self-pay | Admitting: Cardiovascular Disease

## 2015-11-24 MED ORDER — METOPROLOL SUCCINATE ER 25 MG PO TB24: 25.0000 mg | ORAL_TABLET | Freq: Every day | ORAL | Status: DC

## 2015-11-24 NOTE — Telephone Encounter (Signed)
Spoke with pt and gave her information from Dr. Elease HashimotoNahser. She does not need new prescription for Toprol sent in. She will still be wearing monitor at time of appt on 6/26 with Ronie Spiesayna Dunn, PA.  She will send monitor in on 6/30.  Appt on 6/26 cancelled and pt scheduled to see Tereso NewcomerScott Weaver, PA on 7/6 so monitor results will be available.  Pt reports some shortness of breath when she wakes up at night since increasing Toprol. She thinks it is because she is so tired.  I told her Toprol can cause fatigue and to discuss this with provider at next office visit.

## 2015-11-24 NOTE — Telephone Encounter (Signed)
Continue the current dose of metoprolol which seems to be working . - doses have to be adjusted on occasion This may be partially due to the paxil. Will not be able to know exactly why she was controlled on a lower dose previously

## 2015-11-24 NOTE — Telephone Encounter (Signed)
Follow up ° °Pt returned call  °

## 2015-11-24 NOTE — Telephone Encounter (Signed)
See previous phone note.  

## 2015-11-24 NOTE — Telephone Encounter (Signed)
Left message to call back  

## 2015-11-25 ENCOUNTER — Encounter: Payer: Self-pay | Admitting: Endocrinology

## 2015-11-25 ENCOUNTER — Ambulatory Visit (INDEPENDENT_AMBULATORY_CARE_PROVIDER_SITE_OTHER): Payer: BLUE CROSS/BLUE SHIELD | Admitting: Endocrinology

## 2015-11-25 ENCOUNTER — Telehealth: Payer: Self-pay

## 2015-11-25 VITALS — BP 110/80 | HR 120 | Ht 61.0 in | Wt 120.0 lb

## 2015-11-25 DIAGNOSIS — E559 Vitamin D deficiency, unspecified: Secondary | ICD-10-CM | POA: Diagnosis not present

## 2015-11-25 LAB — VITAMIN D 25 HYDROXY (VIT D DEFICIENCY, FRACTURES): VITD: 51.78 ng/mL (ref 30.00–100.00)

## 2015-11-25 MED ORDER — LORAZEPAM 1 MG PO TABS: 1.0000 mg | ORAL_TABLET | Freq: Three times a day (TID) | ORAL | Status: DC

## 2015-11-25 NOTE — Patient Instructions (Signed)
blood tests are requested for you today.  We'll let you know about the results. Keeping the vitamin-D and calcium up to a normal range help your heart rate.   I would be happy to see you back here as necessary.

## 2015-11-25 NOTE — Telephone Encounter (Signed)
Rx called into the pharmacy.  Jen-please see message below.  Thanks.

## 2015-11-25 NOTE — Progress Notes (Signed)
Subjective:    Patient ID: Wendy Harrell, female    DOB: 07-05-1975, 40 y.o.   MRN: [id number]  HPI Pt was noted to have vit-d deficiency in 2015.  She had slight weakness of the arms, but no assoc cramps.  She has no h/o weight loss surgery or renal dz.  She has been on vit-d replacement several times.  She was noted last month to have severe hypocalcemia, but repeat was normal.  She has also been noted to have tachycardia--w/u also neg.   Past Medical History  Diagnosis Date  . Chicken pox as a child  . 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 Interstitial cystitis 03/01/2011  . Dermatitis 03/01/2011  . Cervical cancer screening 08/29/2013  . Diarrhea 08/23/2014  . Loss of weight 08/23/2014  . Raynaud's disease   . GERD (gastroesophageal reflux disease)   . Slipped cervical disc   . 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  Resting HR of 100-110 is not unusual  . Vitamin D deficiency 10/07/2015  . Vitamin B 12 deficiency 10/21/2015    Past Surgical History  Procedure Laterality Date  . Laproscopy  2001  . Cystoscopy  2001    benign  . Lumbar laminectomy/decompression microdiscectomy Left 08/26/2015    Procedure: MICRO LUMBAR DECOMPRESSION  L5 - S1 ON LEFT;  Surgeon: Jene EveryJeffrey Beane, MD;  Location: [facility];  Service: Orthopedics;  Laterality: Left;    Social History   Social History  . Marital Status: Married    Spouse Name: N/A  . Number of Children: 1  . Years of Education: N/A   Occupational History  . 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 Smoking status: Never Smoker   . Smokeless tobacco: Never Used  . Alcohol Use: No  . Drug Use: No  . Sexual Activity:    Partners: Male    Birth Control/ Protection: Surgical   Other Topics Concern  . Not on file   Social History Narrative    Current Outpatient Prescriptions on File Prior to Visit  Medication Sig Dispense Refill  . amitriptyline (ELAVIL) 25 MG tablet Take 1 tablet (25 mg total) by mouth at bedtime. 15 tablet  0  . metoprolol succinate (TOPROL XL) 25 MG 24 hr tablet Take 1 tablet (25 mg total) by mouth daily. 90 tablet 3  . PARoxetine (PAXIL) 10 MG tablet Take 1 tablet (10 mg total) by mouth daily. (Patient not taking: Reported on 11/23/2015) 30 tablet 3  . propranolol (INDERAL) 10 MG tablet Take 10 mg by mouth 3 (three) times daily. Reported on 11/25/2015     No current facility-administered medications on file prior to visit.    Allergies  Allergen Reactions  . Robaxin [Methocarbamol] Other (See Comments)    syncope  . Adhesive [Tape] Rash    Band-Aids gives pt a rash  . Sulfa Antibiotics Rash    Blisters in the mouth    Family History  Problem Relation Age of Onset  . Depression Father   . Heart disease Father   . Hypertension Father   . Hyperlipidemia Father   . Diabetes Father     type 2  . Arthritis Father   . Anxiety disorder Father   . Alcohol abuse Maternal Grandfather   . Depression Paternal Grandmother   . Alcohol abuse Paternal Grandmother   . Alcohol abuse Paternal Grandfather   . Heart attack Paternal Grandfather   . Cancer Maternal Grandmother  skin cancer, breast cancer  . Alzheimer's disease Maternal Aunt   . Hyperlipidemia Mother   . Colon cancer Neg Hx   . Colon polyps Father   . Esophageal cancer Neg Hx   . Gallbladder disease Maternal Aunt     x4  . Gallbladder disease Maternal Uncle   . Gallbladder disease Maternal Grandmother   . Hyperparathyroidism Mother     BP 110/80 mmHg  Pulse 120  Ht  (1.549 m)  Wt 120 lb (54.432 kg)  BMI 22.69 kg/m2  SpO2 93%  LMP 11/02/2015  Review of Systems denies myalgias, rash, sob, numbness, fever, urinary frequency, rhinorrhea, and blurry vision. She has intermittent diarrhea (GI eval was neg).  She had 1 episode of syncope--after back surgery.  She attributes fatigue to toprol.  She has cold intolerance, easy bruising, and palpitations.       Objective:   Physical Exam VS: see vs page GEN: no  distress HEAD: head: no deformity eyes: no periorbital swelling, no proptosis external nose and ears are normal mouth: no lesion seen NECK: supple, thyroid is not enlarged CHEST WALL: no deformity.  No kyphosis. LUNGS: clear to auscultation CV: reg rate and rhythm, no murmur ABD: abdomen is soft, nontender.  no hepatosplenomegaly.  not distended.  no hernia MUSCULOSKELETAL: muscle bulk and strength are grossly normal.  no obvious joint swelling.  gait is normal and steady.  She favors the lower back.   EXTEMITIES: no deformity.  no ulcer on the feet.  feet are of normal color and temp.  no edema PULSES: dorsalis pedis intact bilat.  no carotid bruit NEURO:  cn 2-12 grossly intact.   readily moves all 4's.  sensation is intact to touch on the feet SKIN:  Normal texture and temperature.  No rash or suspicious lesion is visible.   NODES:  None palpable at the neck PSYCH: alert, well-oriented.  Does not appear anxious nor depressed.  Lab Results  Component Value Date   TSH 1.33 10/07/2015   Lab Results  Component Value Date   PTH 71* 10/28/2015   CALCIUM 9.4 10/29/2015  vit-D=22    Assessment & Plan:  Hypocalcemia: the very low number was probably an erroneous result.  Tachycardia.  This could be exac by hypocalcemia. Vit-D deficiency, new to me, mild.  Patient is advised the following: Patient Instructions  blood tests are requested for you today.  We'll let you know about the results. Keeping the vitamin-D and calcium up to a normal range help your heart rate.   I would be happy to see you back here as necessary.    Romero Belling, MD

## 2015-11-25 NOTE — Telephone Encounter (Signed)
Please refill the Lorazepam with same strength, same sig, #40 and schedule her an appointment to see me in the next month. Also ask referral coordinator to check how we go about havin her reassigned [facility] a [facility]. Thanks Dr B

## 2015-11-26 LAB — PTH, INTACT AND CALCIUM
Calcium: 9.8 mg/dL (ref 8.4–10.5)
PTH: 15 pg/mL (ref 14–64)

## 2015-11-26 NOTE — Telephone Encounter (Signed)
[facility]. Left detail message on phone, [facility]

## 2015-12-02 ENCOUNTER — Telehealth: Payer: Self-pay | Admitting: Family Medicine

## 2015-12-02 NOTE — Telephone Encounter (Signed)
Patient called this afternoon to schedule appointment.  She wants to see you for her "anxiety" because Dr. Abner GreenspanBlyth is too hard to get an appointment with but patient has no plan to change PCP.  I told her that she should see her PCP but she insists that this is closer and she also states that she has seen multiple PA's are [facility].  I advised pt I would send a message to Dr Claiborne BillingsKuneff and ask if it was okay to schedule.  Please advise.

## 2015-12-03 NOTE — Telephone Encounter (Signed)
Spoke with patient she states she is not sure if she wants to come her on a regular basis. She requested a meeting with Dr Claiborne BillingsKuneff to see if she wanted to switch Dr. Effie BerkshireExplained to patient we didn't do consultations but would be willing to see her for any acute visits if she cant get in to her PCP or would be happy to schedule her for an establishment visit if she decided to switch. Patient verbalized understanding.

## 2015-12-03 NOTE — Telephone Encounter (Signed)
Being seen by multiple providers for this is not ideal. I will see her, but she should try to follow with one provider.

## 2015-12-06 ENCOUNTER — Ambulatory Visit: Payer: BLUE CROSS/BLUE SHIELD | Admitting: Physician Assistant

## 2015-12-08 ENCOUNTER — Other Ambulatory Visit: Payer: Self-pay | Admitting: Cardiovascular Disease

## 2015-12-08 DIAGNOSIS — I471 Supraventricular tachycardia: Secondary | ICD-10-CM

## 2015-12-08 MED ORDER — METOPROLOL SUCCINATE ER 25 MG PO TB24
25.0000 mg | ORAL_TABLET | Freq: Every day | ORAL | Status: DC
Start: 2015-12-08 — End: 2016-01-03

## 2015-12-16 ENCOUNTER — Ambulatory Visit (INDEPENDENT_AMBULATORY_CARE_PROVIDER_SITE_OTHER): Payer: BLUE CROSS/BLUE SHIELD | Admitting: Physician Assistant

## 2015-12-16 ENCOUNTER — Encounter: Payer: Self-pay | Admitting: Physician Assistant

## 2015-12-16 VITALS — BP 112/70 | HR 112 | Ht 62.0 in | Wt 119.4 lb

## 2015-12-16 DIAGNOSIS — R000 Tachycardia, unspecified: Secondary | ICD-10-CM

## 2015-12-16 DIAGNOSIS — F411 Generalized anxiety disorder: Secondary | ICD-10-CM

## 2015-12-16 NOTE — Progress Notes (Signed)
Cardiology Office Note:    Date:  12/16/2015   ID:  Wendy Harrell, DOB Oct 18, 1975, MRN [id number]  PCP:  Danise Edge, MD  Cardiologist:  Dr. Delane Ginger   Electrophysiologist:  n/a  Referring MD: Bradd Canary, MD   Chief Complaint  Patient presents with  . Follow-up    palpitations    History of Present Illness:     Wendy Harrell is a 40 y.o. female with a hx of sinus tachycardia, Raynaud's disease, GERD, anxiety.  She has had significant fatigue with Metoprolol in the past and had to stop it.  She saw Dr. Delane Ginger in 4/17 who started Metoprolol Succinate for continued symptomatic sinus tachycardia.  TSH in 4/17 was normal.  She FU with Ronie Spies, PA-C in 5/17.  Echo was obtained and was normal. Event monitor was obtained and demonstrated no sig arrhythmias.  She returns for FU.  She still feels anxious.  She feels better off of Paxil.  PCP is managing her anxiety with Ativan. She denies chest pain, syncope, dyspnea, edema.    Past Medical History  Diagnosis Date  . Chicken pox as a child  . 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 Interstitial cystitis 03/01/2011  . Dermatitis 03/01/2011  . Cervical cancer screening 08/29/2013  . Loss of weight 08/23/2014  . Raynaud's disease   . GERD (gastroesophageal reflux disease)   . Slipped cervical disc   . 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  Resting HR of 100-110 is not unusual  //  Event monitor 6/17: NSR, no sig arrhythmias   . Vitamin D deficiency 10/07/2015  . Vitamin B 12 deficiency 10/21/2015  . History of echocardiogram     a. Echo 6/17: EF 55-60%, no RWMA, normal diastolic function, trivial MR, mild TR    Past Surgical History  Procedure Laterality Date  . Laproscopy  2001  . Cystoscopy  2001    benign  . Lumbar laminectomy/decompression microdiscectomy Left 08/26/2015    Procedure: MICRO LUMBAR DECOMPRESSION  L5 - S1 ON LEFT;  Surgeon: Jene Every, MD;  Location: [facility];  Service: Orthopedics;  Laterality: Left;      Current Medications: Outpatient Prescriptions Prior to Visit  Medication Sig Dispense Refill  . metoprolol succinate (TOPROL XL) 25 MG 24 hr tablet Take 1 tablet (25 mg total) by mouth daily. 90 tablet 3  . amitriptyline (ELAVIL) 25 MG tablet Take 1 tablet (25 mg total) by mouth at bedtime. (Patient not taking: Reported on 12/16/2015) 15 tablet 0  . LORazepam (ATIVAN) 1 MG tablet Take 1 tablet (1 mg total) by mouth every 8 (eight) hours. (Patient not taking: Reported on 12/16/2015) 40 tablet 0  . PARoxetine (PAXIL) 10 MG tablet Take 1 tablet (10 mg total) by mouth daily. (Patient not taking: Reported on 11/23/2015) 30 tablet 3  . propranolol (INDERAL) 10 MG tablet Take 10 mg by mouth 3 (three) times daily. Reported on 12/16/2015     No facility-administered medications prior to visit.      Allergies:   Robaxin; Adhesive; and Sulfa antibiotics   Social History   Social History  . Marital Status: Married    Spouse Name: N/A  . Number of Children: 1  . Years of Education: N/A   Occupational History  . 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 Smoking status: Never Smoker   . Smokeless tobacco: Never Used  . Alcohol Use: No  . Drug Use: No  . Sexual  Activity:    Partners: Male    Pharmacist, hospitalBirth Control/ Protection: Surgical   Other Topics Concern  . None   Social History Narrative     Family History:  The patient's family history includes Alcohol abuse in her maternal grandfather, paternal grandfather, and paternal grandmother; Alzheimer's disease in her maternal aunt; Anxiety disorder in her father; Arthritis in her father; Cancer in her maternal grandmother; Colon polyps in her father; Depression in her father and paternal grandmother; Diabetes in her father; Gallbladder disease in her maternal aunt, maternal grandmother, and maternal uncle; Heart attack in her paternal grandfather; Heart disease in her father; Hyperlipidemia in her father and mother; Hyperparathyroidism in her  mother; Hypertension in her father. There is no history of Colon cancer or Esophageal cancer.   ROS:   Please see the history of present illness.    Review of Systems  Gastrointestinal: Positive for diarrhea.  Psychiatric/Behavioral: Positive for depression. The patient is nervous/anxious.    All other systems reviewed and are negative.   Physical Exam:    VS:  BP 112/70 mmHg  Pulse 112  Ht 5\' 2"  (1.575 m)  Wt 119 lb 6.4 oz (54.159 kg)  BMI 21.83 kg/m2  LMP 11/02/2015   Physical Exam  Constitutional: She is oriented to person, place, and time. She appears well-developed and well-nourished.  HENT:  Head: Normocephalic and atraumatic.  Neck: No JVD present. No thyromegaly present.  Cardiovascular: Regular rhythm and normal heart sounds.  Tachycardia present.   No murmur heard. Pulmonary/Chest: Effort normal and breath sounds normal. She has no wheezes. She has no rales.  Abdominal: Soft. There is no tenderness.  Musculoskeletal: She exhibits no edema.  Neurological: She is alert and oriented to person, place, and time. She has normal reflexes.  Skin: Skin is warm and dry.  Psychiatric: She has a normal mood and affect.    Wt Readings from Last 3 Encounters:  12/16/15 119 lb 6.4 oz (54.159 kg)  11/25/15 120 lb (54.432 kg)  11/23/15 120 lb 3.2 oz (54.522 kg)     Studies/Labs Reviewed:     EKG:  EKG is not ordered today.  The ekg ordered today demonstrates n/a  Recent Labs: 10/07/2015: TSH 1.33 10/29/2015: ALT 15; BUN 10; Creatinine, Ser 0.51; Hemoglobin 13.0; Platelets 237; Potassium 3.5; Sodium 137   Recent Lipid Panel    Component Value Date/Time   CHOL 190 09/02/2014 0956   TRIG 53.0 09/02/2014 0956   HDL 58.10 09/02/2014 0956   CHOLHDL 3 09/02/2014 0956   VLDL 10.6 09/02/2014 0956   LDLCALC 121* 09/02/2014 0956    Additional studies/ records that were reviewed today include:   Echo 11/16/15- Left ventricle: The cavity size was normal. Systolic function was    normal. The estimated ejection fraction was in the range of 55%   to 60%. Wall motion was normal; there were no regional wall   motion abnormalities. The transmitral flow pattern was normal.   Left ventricular diastolic function parameters were normal. - Aortic valve: Trileaflet; normal thickness, mildly calcified   leaflets. - Mitral valve: There was trivial regurgitation. - Tricuspid valve: There was mild regurgitation.  Event Monitor 11/10/15 Sinus rhythm. No significant arrhythmais   ASSESSMENT:     1. Sinus tachycardia (HCC)   2. Generalized anxiety disorder     PLAN:     In order of problems listed above:  1. Sinus tachycardia - She has a lot of symptoms of palpitations, anxiety.  Her symptoms seem to  have been exacerbated around the time of her back surgery.  I suspect she has a degree of POTS.  She has a prior hx of syncope that seems orthostatic in nature.  We reviewed the results of her event monitor today. We had a long discussion regarding her symptoms and I answered all of her questions today. She seems to feel better off of Paxil. I have suggested that she liberalize salt, push fluids, increase the angle of the head of her bed and continue beta blocker therapy. She should also continue to increase activity. At this point, I would not suggest increasing the dose of her beta blocker. She is concerned about increasing depression with beta blocker therapy. We can always change her to a calcium channel blocker if needed.  2. Anxiety - Follow-up with PCP for management.  Total time spent with patient today 40 minutes. This includes reviewing records, evaluating the patient and coordinating care. Face-to-face time >50%.    Medication Adjustments/Labs and Tests Ordered: Current medicines are reviewed at length with the patient today.  Concerns regarding medicines are outlined above.  Medication changes, Labs and Tests ordered today are outlined in the Patient Instructions noted  below. Patient Instructions  Medication Instructions:  Your physician recommends that you continue on your current medications as directed. Please refer to the Current Medication list given to you today. Labwork: NONE Testing/Procedures: NONE Follow-Up: Your physician wants you to follow-up in: 6 MONTHS WITH DR. Elease HashimotoNAHSER. You will receive a reminder letter in the mail two months in advance. If you don't receive a letter, please call our office to schedule the follow-up appointment. Any Other Special Instructions Will Be Listed Below (If Applicable). INCREASE FLUIDS (WATER, JUICE), INCREASE SALT, INCREASE ACTIVITY If you need a refill on your cardiac medications before your next appointment, please call your pharmacy.   Signed, Tereso NewcomerScott Cornellius Kropp, PA-C  12/16/2015 5:40 PM    [facility] [address], [place], [place]  [address] Phone: [phone]; Fax: [phone]

## 2015-12-16 NOTE — Patient Instructions (Addendum)
Medication Instructions:  Your physician recommends that you continue on your current medications as directed. Please refer to the Current Medication list given to you today. Labwork: NONE Testing/Procedures: NONE Follow-Up: Your physician wants you to follow-up in: 6 MONTHS WITH DR. Elease HashimotoNAHSER. You will receive a reminder letter in the mail two months in advance. If you don't receive a letter, please call our office to schedule the follow-up appointment. Any Other Special Instructions Will Be Listed Below (If Applicable). INCREASE FLUIDS (WATER, JUICE), INCREASE SALT, INCREASE ACTIVITY If you need a refill on your cardiac medications before your next appointment, please call your pharmacy.

## 2015-12-23 ENCOUNTER — Ambulatory Visit: Payer: BLUE CROSS/BLUE SHIELD | Admitting: Internal Medicine

## 2016-01-03 ENCOUNTER — Ambulatory Visit (INDEPENDENT_AMBULATORY_CARE_PROVIDER_SITE_OTHER): Payer: BLUE CROSS/BLUE SHIELD | Admitting: Family Medicine

## 2016-01-03 ENCOUNTER — Encounter: Payer: Self-pay | Admitting: Family Medicine

## 2016-01-03 ENCOUNTER — Other Ambulatory Visit (HOSPITAL_COMMUNITY)
Admission: RE | Admit: 2016-01-03 | Discharge: 2016-01-03 | Disposition: A | Discharge status: Home / Self Care | Payer: BLUE CROSS/BLUE SHIELD | Source: Ambulatory Visit | Attending: Family Medicine | Admitting: Family Medicine

## 2016-01-03 VITALS — BP 108/70 | HR 117 | Temp 98.4°F | Ht 62.0 in | Wt 116.5 lb

## 2016-01-03 DIAGNOSIS — R000 Tachycardia, unspecified: Secondary | ICD-10-CM

## 2016-01-03 DIAGNOSIS — M48061 Spinal stenosis, lumbar region without neurogenic claudication: Secondary | ICD-10-CM

## 2016-01-03 DIAGNOSIS — Z01419 Encounter for gynecological examination (general) (routine) without abnormal findings: Secondary | ICD-10-CM | POA: Insufficient documentation

## 2016-01-03 DIAGNOSIS — E559 Vitamin D deficiency, unspecified: Secondary | ICD-10-CM

## 2016-01-03 DIAGNOSIS — Z113 Encounter for screening for infections with a predominantly sexual mode of transmission: Secondary | ICD-10-CM | POA: Diagnosis present

## 2016-01-03 DIAGNOSIS — N898 Other specified noninflammatory disorders of vagina: Secondary | ICD-10-CM

## 2016-01-03 DIAGNOSIS — Z0000 Encounter for general adult medical examination without abnormal findings: Secondary | ICD-10-CM

## 2016-01-03 DIAGNOSIS — E538 Deficiency of other specified B group vitamins: Secondary | ICD-10-CM | POA: Diagnosis not present

## 2016-01-03 DIAGNOSIS — K219 Gastro-esophageal reflux disease without esophagitis: Secondary | ICD-10-CM

## 2016-01-03 DIAGNOSIS — Z124 Encounter for screening for malignant neoplasm of cervix: Secondary | ICD-10-CM

## 2016-01-03 DIAGNOSIS — K529 Noninfective gastroenteritis and colitis, unspecified: Secondary | ICD-10-CM

## 2016-01-03 DIAGNOSIS — M5126 Other intervertebral disc displacement, lumbar region: Secondary | ICD-10-CM

## 2016-01-03 DIAGNOSIS — F411 Generalized anxiety disorder: Secondary | ICD-10-CM

## 2016-01-03 MED ORDER — CARVEDILOL 3.125 MG PO TABS: 3.1250 mg | ORAL_TABLET | Freq: Two times a day (BID) | ORAL | 3 refills | Status: DC

## 2016-01-03 NOTE — Progress Notes (Signed)
Pre visit review using our clinic review tool, if applicable. No additional management support is needed unless otherwise documented below in the visit note. 

## 2016-01-03 NOTE — Assessment & Plan Note (Signed)
Pap today, no concerns on exam.  

## 2016-01-03 NOTE — Assessment & Plan Note (Addendum)
Not taking the Vitamin D regularly encouraged to do so

## 2016-01-03 NOTE — Patient Instructions (Addendum)
[place] Astringent clean with each BM Zinc 50 mg daily Lidocaine patches to back and left leg at bedtime 2 tums at bed. Pepcid 10-40 mg at dinner Capsacin cream applied routinely for nerve pain NOW probiotic, 10 strain cap 1 daily, [email or web address] [email or web address]  Preventive Care for Adults, Female A healthy lifestyle and preventive care can promote health and wellness. Preventive health guidelines for women include the following key practices.  A routine yearly physical is a good way to check with your health   provider about your health and preventive screening. It is a chance to share any concerns and updates on your health and to receive a thorough exam.  Visit your dentist for a routine exam and preventive care every 6 months. Brush your teeth twice a day and floss once a day. Good oral hygiene prevents tooth decay and gum disease.  The frequency of eye exams is based on your age, health, family medical history, use of contact lenses, and other factors. Follow your health care provider's recommendations for frequency of eye exams.  Eat a healthy diet. Foods like vegetables, fruits, whole grains, low-fat dairy products, and lean protein foods contain the nutrients you need without too many calories. Decrease your intake of foods high in solid fats, added sugars, and salt. Eat the right amount of calories for you.Get information about a proper diet from your health care provider, if necessary.  Regular physical exercise is one of the most important things you can do for your health. Most adults should get at least 150 minutes of moderate-intensity exercise (any activity that increases your heart rate and causes you to sweat) each week. In addition, most adults need muscle-strengthening exercises on 2 or more days a week.  Maintain a healthy weight. The body mass index (BMI) is a screening tool to identify possible weight problems. It provides an estimate of body fat based on height and weight.  Your health care provider can find your BMI and can help you achieve or maintain a healthy weight.For adults 20 years and older:  A BMI below 18.5 is considered underweight.  A BMI of 18.5 to 24.9 is normal.  A BMI of 25 to 29.9 is considered overweight.  A BMI of 30 and above is considered obese.  Maintain normal blood lipids and cholesterol levels by exercising and minimizing your intake of saturated fat. Eat a balanced diet with plenty of fruit and vegetables. Blood tests for lipids and cholesterol should begin at age 85 and be repeated every 5 years. If your lipid or cholesterol levels are high, you are over 50, or you are at high risk for heart disease, you may need your cholesterol levels checked more frequently.Ongoing high lipid and cholesterol levels should be treated with medicines if diet and exercise are not working.  If you smoke, find out from your health care provider how to quit. If you do not use tobacco, do not start.  Lung cancer screening is recommended for adults aged 70-80 years who are at high risk for developing lung cancer because of a history of smoking. A yearly low-dose CT scan of the lungs is recommended for people who have at least a 30-pack-year history of smoking and are a current smoker or have quit within the past 15 years. A pack year of smoking is smoking an average of 1 pack of cigarettes a day for 1 year (for example: 1 pack a day for 30 years or 2 packs a day for 15 years). Yearly  screening should continue until the smoker has stopped smoking for at least 15 years. Yearly screening should be stopped for people who develop a health problem that would prevent them from having lung cancer treatment.  If you are pregnant, do not drink alcohol. If you are breastfeeding, be very cautious about drinking alcohol. If you are not pregnant and choose to drink alcohol, do not have more than 1 drink per day. One drink is considered to be 12 ounces (355 mL) of beer, 5  ounces (148 mL) of wine, or 1.5 ounces (44 mL) of liquor.  Avoid use of street drugs. Do not share needles with anyone. Ask for help if you need support or instructions about stopping the use of drugs.  High blood pressure causes heart disease and increases the risk of stroke. Your blood pressure should be checked at least every 1 to 2 years. Ongoing high blood pressure should be treated with medicines if weight loss and exercise do not work.  If you are 53-110 years old, ask your health care provider if you should take aspirin to prevent strokes.  Diabetes screening is done by taking a blood sample to check your blood glucose level after you have not eaten for a certain period of time (fasting). If you are not overweight and you do not have risk factors for diabetes, you should be screened once every 3 years starting at age 13. If you are overweight or obese and you are 8-20 years of age, you should be screened for diabetes every year as part of your cardiovascular risk assessment.  Breast cancer screening is essential preventive care for women. You should practice "breast self-awareness." This means understanding the normal appearance and feel of your breasts and may include breast self-examination. Any changes detected, no matter how small, should be reported to a health care provider. Women in their 25s and 30s should have a clinical breast exam (CBE) by a health care provider as part of a regular health exam every 1 to 3 years. After age 56, women should have a CBE every year. Starting at age 64, women should consider having a mammogram (breast X-ray test) every year. Women who have a family history of breast cancer should talk to their health care provider about genetic screening. Women at a high risk of breast cancer should talk to their health care providers about having an MRI and a mammogram every year.  Breast cancer gene (BRCA)-related cancer risk assessment is recommended for women who have  family members with BRCA-related cancers. BRCA-related cancers include breast, ovarian, tubal, and peritoneal cancers. Having family members with these cancers may be associated with an increased risk for harmful changes (mutations) in the breast cancer genes BRCA1 and BRCA2. Results of the assessment will determine the need for genetic counseling and BRCA1 and BRCA2 testing.  Your health care provider may recommend that you be screened regularly for cancer of the pelvic organs (ovaries, uterus, and vagina). This screening involves a pelvic examination, including checking for microscopic changes to the surface of your cervix (Pap test). You may be encouraged to have this screening done every 3 years, beginning at age 74.  For women ages 66-65, health care providers may recommend pelvic exams and Pap testing every 3 years, or they may recommend the Pap and pelvic exam, combined with testing for human papilloma virus (HPV), every 5 years. Some types of HPV increase your risk of cervical cancer. Testing for HPV may also be done on women of  any age with unclear Pap test results.  Other health care providers may not recommend any screening for nonpregnant women who are considered low risk for pelvic cancer and who do not have symptoms. Ask your health care provider if a screening pelvic exam is right for you.  If you have had past treatment for cervical cancer or a condition that could lead to cancer, you need Pap tests and screening for cancer for at least 20 years after your treatment. If Pap tests have been discontinued, your risk factors (such as having a new sexual partner) need to be reassessed to determine if screening should resume. Some women have medical problems that increase the chance of getting cervical cancer. In these cases, your health care provider may recommend more frequent screening and Pap tests.  Colorectal cancer can be detected and often prevented. Most routine colorectal cancer  screening begins at the age of 8 years and continues through age 52 years. However, your health care provider may recommend screening at an earlier age if you have risk factors for colon cancer. On a yearly basis, your health care provider may provide home test kits to check for hidden blood in the stool. Use of a small camera at the end of a tube, to directly examine the colon (sigmoidoscopy or colonoscopy), can detect the earliest forms of colorectal cancer. Talk to your health care provider about this at age 52, when routine screening begins. Direct exam of the colon should be repeated every 5-10 years through age 40 years, unless early forms of precancerous polyps or small growths are found.  People who are at an increased risk for hepatitis B should be screened for this virus. You are considered at high risk for hepatitis B if:  You were born in a country where hepatitis B occurs often. Talk with your health care provider about which countries are considered high risk.  Your parents were born in a high-risk country and you have not received a shot to protect against hepatitis B (hepatitis B vaccine).  You have HIV or AIDS.  You use needles to inject street drugs.  You live with, or have sex with, someone who has hepatitis B.  You get hemodialysis treatment.  You take certain medicines for conditions like cancer, organ transplantation, and autoimmune conditions.  Hepatitis C blood testing is recommended for all people born from [birth year] through 1965 and any individual with known risks for hepatitis C.  Practice safe sex. Use condoms and avoid high-risk sexual practices to reduce the spread of sexually transmitted infections (STIs). STIs include gonorrhea, chlamydia, syphilis, trichomonas, herpes, HPV, and human immunodeficiency virus (HIV). Herpes, HIV, and HPV are viral illnesses that have no cure. They can result in disability, cancer, and death.  You should be screened for sexually  transmitted illnesses (STIs) including gonorrhea and chlamydia if:  You are sexually active and are younger than 24 years.  You are older than 24 years and your health care provider tells you that you are at risk for this type of infection.  Your sexual activity has changed since you were last screened and you are at an increased risk for chlamydia or gonorrhea. Ask your health care provider if you are at risk.  If you are at risk of being infected with HIV, it is recommended that you take a prescription medicine daily to prevent HIV infection. This is called preexposure prophylaxis (PrEP). You are considered at risk if:  You are sexually active and do not regularly  use condoms or know the HIV status of your partner(s).  You take drugs by injection.  You are sexually active with a partner who has HIV.  Talk with your health care provider about whether you are at high risk of being infected with HIV. If you choose to begin PrEP, you should first be tested for HIV. You should then be tested every 3 months for as long as you are taking PrEP.  Osteoporosis is a disease in which the bones lose minerals and strength with aging. This can result in serious bone fractures or breaks. The risk of osteoporosis can be identified using a bone density scan. Women ages 75 years and over and women at risk for fractures or osteoporosis should discuss screening with their health care providers. Ask your health care provider whether you should take a calcium supplement or vitamin D to reduce the rate of osteoporosis.  Menopause can be associated with physical symptoms and risks. Hormone replacement therapy is available to decrease symptoms and risks. You should talk to your health care provider about whether hormone replacement therapy is right for you.  Use sunscreen. Apply sunscreen liberally and repeatedly throughout the day. You should seek shade when your shadow is shorter than you. Protect yourself by  wearing long sleeves, pants, a wide-brimmed hat, and sunglasses year round, whenever you are outdoors.  Once a month, do a whole body skin exam, using a mirror to look at the skin on your back. Tell your health care provider of new moles, moles that have irregular borders, moles that are larger than a pencil eraser, or moles that have changed in shape or color.  Stay current with required vaccines (immunizations).  Influenza vaccine. All adults should be immunized every year.  Tetanus, diphtheria, and acellular pertussis (Td, Tdap) vaccine. Pregnant women should receive 1 dose of Tdap vaccine during each pregnancy. The dose should be obtained regardless of the length of time since the last dose. Immunization is preferred during the 27th-36th week of gestation. An adult who has not previously received Tdap or who does not know her vaccine status should receive 1 dose of Tdap. This initial dose should be followed by tetanus and diphtheria toxoids (Td) booster doses every 10 years. Adults with an unknown or incomplete history of completing a 3-dose immunization series with Td-containing vaccines should begin or complete a primary immunization series including a Tdap dose. Adults should receive a Td booster every 10 years.  Varicella vaccine. An adult without evidence of immunity to varicella should receive 2 doses or a second dose if she has previously received 1 dose. Pregnant females who do not have evidence of immunity should receive the first dose after pregnancy. This first dose should be obtained before leaving the health care facility. The second dose should be obtained 4-8 weeks after the first dose.  Human papillomavirus (HPV) vaccine. Females aged 13-26 years who have not received the vaccine previously should obtain the 3-dose series. The vaccine is not recommended for use in pregnant females. However, pregnancy testing is not needed before receiving a dose. If a female is found to be pregnant  after receiving a dose, no treatment is needed. In that case, the remaining doses should be delayed until after the pregnancy. Immunization is recommended for any person with an immunocompromised condition through the age of 58 years if she did not get any or all doses earlier. During the 3-dose series, the second dose should be obtained 4-8 weeks after the first dose.  The third dose should be obtained 24 weeks after the first dose and 16 weeks after the second dose.  Zoster vaccine. One dose is recommended for adults aged 100 years or older unless certain conditions are present.  Measles, mumps, and rubella (MMR) vaccine. Adults born before [birth year] generally are considered immune to measles and mumps. Adults born in [birth year] or later should have 1 or more doses of MMR vaccine unless there is a contraindication to the vaccine or there is laboratory evidence of immunity to each of the three diseases. A routine second dose of MMR vaccine should be obtained at least 28 days after the first dose for students attending postsecondary schools, health care workers, or international travelers. People who received inactivated measles vaccine or an unknown type of measles vaccine during 1963-1967 should receive 2 doses of MMR vaccine. People who received inactivated mumps vaccine or an unknown type of mumps vaccine before 1979 and are at high risk for mumps infection should consider immunization with 2 doses of MMR vaccine. For females of childbearing age, rubella immunity should be determined. If there is no evidence of immunity, females who are not pregnant should be vaccinated. If there is no evidence of immunity, females who are pregnant should delay immunization until after pregnancy. Unvaccinated health care workers born before [birth year] who lack laboratory evidence of measles, mumps, or rubella immunity or laboratory confirmation of disease should consider measles and mumps immunization with 2 doses of MMR vaccine or rubella  immunization with 1 dose of MMR vaccine.  Pneumococcal 13-valent conjugate (PCV13) vaccine. When indicated, a person who is uncertain of his immunization history and has no record of immunization should receive the PCV13 vaccine. All adults 50 years of age and older should receive this vaccine. An adult aged 53 years or older who has certain medical conditions and has not been previously immunized should receive 1 dose of PCV13 vaccine. This PCV13 should be followed with a dose of pneumococcal polysaccharide (PPSV23) vaccine. Adults who are at high risk for pneumococcal disease should obtain the PPSV23 vaccine at least 8 weeks after the dose of PCV13 vaccine. Adults older than 40 years of age who have normal immune system function should obtain the PPSV23 vaccine dose at least 1 year after the dose of PCV13 vaccine.  Pneumococcal polysaccharide (PPSV23) vaccine. When PCV13 is also indicated, PCV13 should be obtained first. All adults aged 75 years and older should be immunized. An adult younger than age 63 years who has certain medical conditions should be immunized. Any person who resides in a nursing home or long-term care facility should be immunized. An adult smoker should be immunized. People with an immunocompromised condition and certain other conditions should receive both PCV13 and PPSV23 vaccines. People with human immunodeficiency virus (HIV) infection should be immunized as soon as possible after diagnosis. Immunization during chemotherapy or radiation therapy should be avoided. Routine use of PPSV23 vaccine is not recommended for American Indians, [place] Natives, or people younger than 65 years unless there are medical conditions that require PPSV23 vaccine. When indicated, people who have unknown immunization and have no record of immunization should receive PPSV23 vaccine. One-time revaccination 5 years after the first dose of PPSV23 is recommended for people aged 19-64 years who have chronic  kidney failure, nephrotic syndrome, asplenia, or immunocompromised conditions. People who received 1-2 doses of PPSV23 before age 45 years should receive another dose of PPSV23 vaccine at age 49 years or later if at least 5 years have passed  since the previous dose. Doses of PPSV23 are not needed for people immunized with PPSV23 at or after age 53 years.  Meningococcal vaccine. Adults with asplenia or persistent complement component deficiencies should receive 2 doses of quadrivalent meningococcal conjugate (MenACWY-D) vaccine. The doses should be obtained at least 2 months apart. Microbiologists working with certain meningococcal bacteria, [employer] recruits, people at risk during an outbreak, and people who travel to or live in countries with a high rate of meningitis should be immunized. A first-year college student up through age 32 years who is living in a residence hall should receive a dose if she did not receive a dose on or after her 16th birthday. Adults who have certain high-risk conditions should receive one or more doses of vaccine.  Hepatitis A vaccine. Adults who wish to be protected from this disease, have certain high-risk conditions, work with hepatitis A-infected animals, work in hepatitis A research labs, or travel to or work in countries with a high rate of hepatitis A should be immunized. Adults who were previously unvaccinated and who anticipate close contact with an international adoptee during the first 60 days after arrival in the [place] from a country with a high rate of hepatitis A should be immunized.  Hepatitis B vaccine. Adults who wish to be protected from this disease, have certain high-risk conditions, may be exposed to blood or other infectious body fluids, are household contacts or sex partners of hepatitis B positive people, are clients or workers in certain care facilities, or travel to or work in countries with a high rate of hepatitis B should be  immunized.  Haemophilus influenzae type b (Hib) vaccine. A previously unvaccinated person with asplenia or sickle cell disease or having a scheduled splenectomy should receive 1 dose of Hib vaccine. Regardless of previous immunization, a recipient of a hematopoietic stem cell transplant should receive a 3-dose series 6-12 months after her successful transplant. Hib vaccine is not recommended for adults with HIV infection. Preventive Services / Frequency Ages 4 to 9 years  Blood pressure check.** / Every 3-5 years.  Lipid and cholesterol check.** / Every 5 years beginning at age 86.  Clinical breast exam.** / Every 3 years for women in their 31s and 24s.  BRCA-related cancer risk assessment.** / For women who have family members with a BRCA-related cancer (breast, ovarian, tubal, or peritoneal cancers).  Pap test.** / Every 2 years from ages 64 through 75. Every 3 years starting at age 4 through age 10 or 24 with a history of 3 consecutive normal Pap tests.  HPV screening.** / Every 3 years from ages 27 through ages 16 to 70 with a history of 3 consecutive normal Pap tests.  Hepatitis C blood test.** / For any individual with known risks for hepatitis C.  Skin self-exam. / Monthly.  Influenza vaccine. / Every year.  Tetanus, diphtheria, and acellular pertussis (Tdap, Td) vaccine.** / Consult your health care provider. Pregnant women should receive 1 dose of Tdap vaccine during each pregnancy. 1 dose of Td every 10 years.  Varicella vaccine.** / Consult your health care provider. Pregnant females who do not have evidence of immunity should receive the first dose after pregnancy.  HPV vaccine. / 3 doses over 6 months, if 35 and younger. The vaccine is not recommended for use in pregnant females. However, pregnancy testing is not needed before receiving a dose.  Measles, mumps, rubella (MMR) vaccine.** / You need at least 1 dose of MMR if you were  born in [birth year] or later. You may also need  a 2nd dose. For females of childbearing age, rubella immunity should be determined. If there is no evidence of immunity, females who are not pregnant should be vaccinated. If there is no evidence of immunity, females who are pregnant should delay immunization until after pregnancy.  Pneumococcal 13-valent conjugate (PCV13) vaccine.** / Consult your health care provider.  Pneumococcal polysaccharide (PPSV23) vaccine.** / 1 to 2 doses if you smoke cigarettes or if you have certain conditions.  Meningococcal vaccine.** / 1 dose if you are age 47 to 102 years and a Market researcher living in a residence hall, or have one of several medical conditions, you need to get vaccinated against meningococcal disease. You may also need additional booster doses.  Hepatitis A vaccine.** / Consult your health care provider.  Hepatitis B vaccine.** / Consult your health care provider.  Haemophilus influenzae type b (Hib) vaccine.** / Consult your health care provider. Ages 73 to 42 years  Blood pressure check.** / Every year.  Lipid and cholesterol check.** / Every 5 years beginning at age 54 years.  Lung cancer screening. / Every year if you are aged 50-80 years and have a 30-pack-year history of smoking and currently smoke or have quit within the past 15 years. Yearly screening is stopped once you have quit smoking for at least 15 years or develop a health problem that would prevent you from having lung cancer treatment.  Clinical breast exam.** / Every year after age 22 years.  BRCA-related cancer risk assessment.** / For women who have family members with a BRCA-related cancer (breast, ovarian, tubal, or peritoneal cancers).  Mammogram.** / Every year beginning at age 53 years and continuing for as long as you are in good health. Consult with your health care provider.  Pap test.** / Every 3 years starting at age 67 years through age 67 or 48 years with a history of 3 consecutive normal Pap  tests.  HPV screening.** / Every 3 years from ages 61 years through ages 73 to 77 years with a history of 3 consecutive normal Pap tests.  Fecal occult blood test (FOBT) of stool. / Every year beginning at age 37 years and continuing until age 18 years. You may not need to do this test if you get a colonoscopy every 10 years.  Flexible sigmoidoscopy or colonoscopy.** / Every 5 years for a flexible sigmoidoscopy or every 10 years for a colonoscopy beginning at age 65 years and continuing until age 31 years.  Hepatitis C blood test.** / For all people born from [birth year] through 1965 and any individual with known risks for hepatitis C.  Skin self-exam. / Monthly.  Influenza vaccine. / Every year.  Tetanus, diphtheria, and acellular pertussis (Tdap/Td) vaccine.** / Consult your health care provider. Pregnant women should receive 1 dose of Tdap vaccine during each pregnancy. 1 dose of Td every 10 years.  Varicella vaccine.** / Consult your health care provider. Pregnant females who do not have evidence of immunity should receive the first dose after pregnancy.  Zoster vaccine.** / 1 dose for adults aged 44 years or older.  Measles, mumps, rubella (MMR) vaccine.** / You need at least 1 dose of MMR if you were born in 1957 or later. You may also need a second dose. For females of childbearing age, rubella immunity should be determined. If there is no evidence of immunity, females who are not pregnant should be vaccinated. If there is no evidence of  immunity, females who are pregnant should delay immunization until after pregnancy.  Pneumococcal 13-valent conjugate (PCV13) vaccine.** / Consult your health care provider.  Pneumococcal polysaccharide (PPSV23) vaccine.** / 1 to 2 doses if you smoke cigarettes or if you have certain conditions.  Meningococcal vaccine.** / Consult your health care provider.  Hepatitis A vaccine.** / Consult your health care provider.  Hepatitis B vaccine.** / Consult  your health care provider.  Haemophilus influenzae type b (Hib) vaccine.** / Consult your health care provider. Ages 81 years and over  Blood pressure check.** / Every year.  Lipid and cholesterol check.** / Every 5 years beginning at age 53 years.  Lung cancer screening. / Every year if you are aged 24-80 years and have a 30-pack-year history of smoking and currently smoke or have quit within the past 15 years. Yearly screening is stopped once you have quit smoking for at least 15 years or develop a health problem that would prevent you from having lung cancer treatment.  Clinical breast exam.** / Every year after age 67 years.  BRCA-related cancer risk assessment.** / For women who have family members with a BRCA-related cancer (breast, ovarian, tubal, or peritoneal cancers).  Mammogram.** / Every year beginning at age 109 years and continuing for as long as you are in good health. Consult with your health care provider.  Pap test.** / Every 3 years starting at age 3 years through age 59 or 45 years with 3 consecutive normal Pap tests. Testing can be stopped between 65 and 70 years with 3 consecutive normal Pap tests and no abnormal Pap or HPV tests in the past 10 years.  HPV screening.** / Every 3 years from ages 79 years through ages 58 or 41 years with a history of 3 consecutive normal Pap tests. Testing can be stopped between 65 and 70 years with 3 consecutive normal Pap tests and no abnormal Pap or HPV tests in the past 10 years.  Fecal occult blood test (FOBT) of stool. / Every year beginning at age 39 years and continuing until age 39 years. You may not need to do this test if you get a colonoscopy every 10 years.  Flexible sigmoidoscopy or colonoscopy.** / Every 5 years for a flexible sigmoidoscopy or every 10 years for a colonoscopy beginning at age 82 years and continuing until age 19 years.  Hepatitis C blood test.** / For all people born from [birth year] through 1965 and any  individual with known risks for hepatitis C.  Osteoporosis screening.** / A one-time screening for women ages 26 years and over and women at risk for fractures or osteoporosis.  Skin self-exam. / Monthly.  Influenza vaccine. / Every year.  Tetanus, diphtheria, and acellular pertussis (Tdap/Td) vaccine.** / 1 dose of Td every 10 years.  Varicella vaccine.** / Consult your health care provider.  Zoster vaccine.** / 1 dose for adults aged 24 years or older.  Pneumococcal 13-valent conjugate (PCV13) vaccine.** / Consult your health care provider.  Pneumococcal polysaccharide (PPSV23) vaccine.** / 1 dose for all adults aged 3 years and older.  Meningococcal vaccine.** / Consult your health care provider.  Hepatitis A vaccine.** / Consult your health care provider.  Hepatitis B vaccine.** / Consult your health care provider.  Haemophilus influenzae type b (Hib) vaccine.** / Consult your health care provider. ** Family history and personal history of risk and conditions may change your health care provider's recommendations.   This information is not intended to replace advice given to you by  your health care provider. Make sure you discuss any questions you have with your health care provider.   Document Released: 07/25/2001 Document Revised: 06/19/2014 Document Reviewed: 10/24/2010 Elsevier Interactive Patient Education 2016 [employer] Inc.  

## 2016-01-04 LAB — COMPREHENSIVE METABOLIC PANEL
ALBUMIN: 4.5 g/dL (ref 3.5–5.2)
ALK PHOS: 58 U/L (ref 39–117)
ALT: 13 U/L (ref 0–35)
AST: 14 U/L (ref 0–37)
BILIRUBIN TOTAL: 0.5 mg/dL (ref 0.2–1.2)
BUN: 12 mg/dL (ref 6–23)
CO2: 30 mEq/L (ref 19–32)
Calcium: 9.3 mg/dL (ref 8.4–10.5)
Chloride: 103 mEq/L (ref 96–112)
Creatinine, Ser: 0.7 mg/dL (ref 0.40–1.20)
GFR: 98.59 mL/min (ref >60.00)
GLUCOSE: 80 mg/dL (ref 70–99)
Potassium: 3.5 mEq/L (ref 3.5–5.1)
Sodium: 138 mEq/L (ref 135–145)
TOTAL PROTEIN: 7.3 g/dL (ref 6.0–8.3)

## 2016-01-04 LAB — T4, FREE: Free T4: 0.84 ng/dL (ref 0.60–1.60)

## 2016-01-04 LAB — VITAMIN D 25 HYDROXY (VIT D DEFICIENCY, FRACTURES): VITD: 38.5 ng/mL (ref 30.00–100.00)

## 2016-01-04 LAB — TSH: TSH: 1.12 u[IU]/mL (ref 0.35–4.50)

## 2016-01-04 LAB — PTH, INTACT AND CALCIUM
Calcium: 9.2 mg/dL (ref 8.4–10.5)
PTH: 41 pg/mL (ref 14–64)

## 2016-01-04 LAB — CBC
HCT: 39 % (ref 36.0–46.0)
HEMOGLOBIN: 13.1 g/dL (ref 12.0–15.0)
MCHC: 33.4 g/dL (ref 30.0–36.0)
MCV: 93.8 fl (ref 78.0–100.0)
PLATELETS: 267 10*3/uL (ref 150.0–400.0)
RBC: 4.16 Mil/uL (ref 3.87–5.11)
RDW: 12.8 % (ref 11.5–15.5)
WBC: 6.2 10*3/uL (ref 4.0–10.5)

## 2016-01-04 LAB — VITAMIN B12: VITAMIN B 12: 237 pg/mL (ref 211–911)

## 2016-01-05 LAB — CYTOLOGY - PAP

## 2016-01-07 LAB — CERVICOVAGINAL ANCILLARY ONLY
Bacterial vaginitis: NEGATIVE
Candida vaginitis: NEGATIVE

## 2016-01-18 ENCOUNTER — Encounter: Payer: Self-pay | Admitting: Family Medicine

## 2016-01-18 DIAGNOSIS — K219 Gastro-esophageal reflux disease without esophagitis: Secondary | ICD-10-CM | POA: Insufficient documentation

## 2016-01-18 HISTORY — DX: Gastro-esophageal reflux disease without esophagitis: K21.9

## 2016-01-18 NOTE — Assessment & Plan Note (Signed)
S/p back surgery is doing better but still has some symptoms in left leg.

## 2016-01-18 NOTE — Assessment & Plan Note (Signed)
Avoid offending foods, start probiotics. Do not eat large meals in late evening and consider raising head of bed.  

## 2016-01-18 NOTE — Assessment & Plan Note (Signed)
Levels wnl 

## 2016-01-18 NOTE — Assessment & Plan Note (Signed)
Started Carvedilol today

## 2016-01-18 NOTE — Assessment & Plan Note (Signed)
Patient encouraged to maintain heart healthy diet, regular exercise, adequate sleep. Consider daily probiotics. Take medications as prescribed. Labs reviewed. Pap today

## 2016-01-18 NOTE — Progress Notes (Signed)
Patient ID: Wendy Harrell, female   DOB: 11-25-1975, 40 y.o.   MRN: [id number]   Subjective:    Patient ID: Wendy Harrell, female    DOB: 1976/05/11, 40 y.o.   MRN: [id number]  Chief Complaint  Patient presents with  . Annual Exam    HPI Patient is in today for annual exam with numerous oncgoing concerns. She has had back surgery and her symptoms have improved but not completely. Still struggles with some low back pain and intermittent weakness, discomfort in left leg. Is able to perform most of her ADLs without difficulty. She continues to struggle with tachycardia, anxiety and reflux. Is following with a counselor and this with low dose Lorazepam has been helpful. Did not tolerate Paxil. Has struggled with intermittent diarrhea over past 4 years. No bloody or tarry stool. Has noted vaginal itching and specks of blood on tissue at times. Denies CP/palp/SOB/HA/congestion/fevers or GU c/o. Taking meds as prescribed  Past Medical History:  Diagnosis Date  . Cervical cancer screening 08/29/2013  . Chicken pox as a child  . Dermatitis 03/01/2011  . GERD (gastroesophageal reflux disease)   . GERD (gastroesophageal reflux disease) 01/18/2016  . History of echocardiogram    a. Echo 6/17: EF 55-60%, no RWMA, normal diastolic function, trivial MR, mild TR  . 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 Interstitial cystitis 03/01/2011  . Loss of weight 08/23/2014  . Raynaud's disease   . Sinus tachycardia (HCC)    Since her 20's.  Resting HR of 100-110 is not unusual  //  Event monitor 6/17: NSR, no sig arrhythmias   . Slipped cervical disc   . Vitamin B 12 deficiency 10/21/2015  . Vitamin D deficiency 10/07/2015    Past Surgical History:  Procedure Laterality Date  . cystoscopy  2001   benign  . laproscopy  2001  . LUMBAR LAMINECTOMY/DECOMPRESSION MICRODISCECTOMY Left 08/26/2015   Procedure: MICRO LUMBAR DECOMPRESSION  L5 - S1 ON LEFT;  Surgeon: Jene Every, MD;  Location: [facility];  Service: Orthopedics;   Laterality: Left;    Family History  Problem Relation Age of Onset  . Depression Father   . Heart disease Father   . Hypertension Father   . Hyperlipidemia Father   . Diabetes Father     type 2  . Arthritis Father   . Anxiety disorder Father   . Colon polyps Father   . Alcohol abuse Maternal Grandfather   . Depression Paternal Grandmother   . Alcohol abuse Paternal Grandmother   . Alcohol abuse Paternal Grandfather   . Heart attack Paternal Grandfather   . Cancer Maternal Grandmother     skin cancer, breast cancer  . Gallbladder disease Maternal Grandmother   . Alzheimer's disease Maternal Aunt   . Hyperlipidemia Mother   . Hyperparathyroidism Mother   . Gallbladder disease Maternal Aunt     x4  . Gallbladder disease Maternal Uncle   . Colon cancer Neg Hx   . Esophageal cancer Neg Hx     Social History   Social History  . Marital status: Married    Spouse name: N/A  . Number of children: 1  . Years of education: N/A   Occupational History  . 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 Smoking status: Never Smoker  . Smokeless tobacco: Never Used  . Alcohol use No  . Drug use: No  . Sexual activity: Yes    Partners: Male    Birth control/ protection: Surgical   Other  Topics Concern  . Not on file   Social History Narrative  . No narrative on file    Outpatient Medications Prior to Visit  Medication Sig Dispense Refill  . GABAPENTIN PO Take 200 mg by mouth at bedtime.    Marland Kitchen LORazepam (ATIVAN) 0.5 MG tablet Take 0.5 mg by mouth at bedtime.    . metoprolol succinate (TOPROL XL) 25 MG 24 hr tablet Take 1 tablet (25 mg total) by mouth daily. 90 tablet 3   No facility-administered medications prior to visit.     Allergies  Allergen Reactions  . Robaxin [Methocarbamol] Other (See Comments)    syncope  . Adhesive [Tape] Rash    Band-Aids gives pt a rash  . Sulfa Antibiotics Rash    Blisters in the mouth    Review of Systems  Constitutional:  Negative for chills, fever and malaise/fatigue.  HENT: Negative for congestion and hearing loss.   Eyes: Negative for discharge.  Respiratory: Negative for cough, sputum production and shortness of breath.   Cardiovascular: Negative for chest pain, palpitations and leg swelling.  Gastrointestinal: Positive for diarrhea and heartburn. Negative for abdominal pain, blood in stool, constipation, nausea and vomiting.  Genitourinary: Negative for dysuria, frequency, hematuria and urgency.  Musculoskeletal: Positive for back pain. Negative for falls and myalgias.  Skin: Negative for rash.  Neurological: Positive for focal weakness. Negative for dizziness, sensory change, loss of consciousness, weakness and headaches.  Endo/Heme/Allergies: Negative for environmental allergies. Does not bruise/bleed easily.  Psychiatric/Behavioral: Negative for depression and suicidal ideas. The patient is nervous/anxious. The patient does not have insomnia.        Objective:    Physical Exam  Constitutional: She is oriented to person, place, and time. She appears well-developed and well-nourished. No distress.  HENT:  Head: Normocephalic and atraumatic.  Right Ear: External ear normal.  Left Ear: External ear normal.  Nose: Nose normal.  Mouth/Throat: Oropharynx is clear and moist.  Eyes: Conjunctivae and EOM are normal. Pupils are equal, round, and reactive to light. Right eye exhibits no discharge. Left eye exhibits no discharge.  Neck: Normal range of motion. Neck supple. No JVD present. No thyromegaly present.  Cardiovascular: Normal rate, regular rhythm, normal heart sounds and intact distal pulses.   No murmur heard. Pulmonary/Chest: Effort normal and breath sounds normal. No respiratory distress. She has no wheezes. She has no rales. She exhibits no tenderness.  Abdominal: Soft. Bowel sounds are normal. She exhibits no distension and no mass. There is no tenderness. There is no rebound and no guarding.    Genitourinary: Vagina normal and uterus normal. Rectal exam shows guaiac negative stool. No vaginal discharge found.  Musculoskeletal: Normal range of motion. She exhibits no edema or tenderness.  Lymphadenopathy:    She has no cervical adenopathy.  Neurological: She is alert and oriented to person, place, and time. She has normal reflexes. No cranial nerve deficit.  Skin: Skin is warm and dry. No rash noted. She is not diaphoretic. No erythema.  Psychiatric: She has a normal mood and affect. Her behavior is normal. Judgment and thought content normal.  Nursing note and vitals reviewed.   BP 108/70 (BP Location: Left Arm, Patient Position: Sitting, Cuff Size: Normal)   Pulse (!) 117   Temp 98.4 F (36.9 C) (Oral)   Ht 5\' 2"  (1.575 m)   Wt 116 lb 8 oz (52.8 kg)   BMI 21.31 kg/m  Wt Readings from Last 3 Encounters:  01/03/16 116 lb 8 oz (  52.8 kg)  12/16/15 119 lb 6.4 oz (54.2 kg)  11/25/15 120 lb (54.4 kg)     Lab Results  Component Value Date   WBC 6.2 01/03/2016   HGB 13.1 01/03/2016   HCT 39.0 01/03/2016   PLT 267.0 01/03/2016   GLUCOSE 80 01/03/2016   CHOL 190 09/02/2014   TRIG 53.0 09/02/2014   HDL 58.10 09/02/2014   LDLCALC 121 (H) 09/02/2014   ALT 13 01/03/2016   AST 14 01/03/2016   NA 138 01/03/2016   K 3.5 01/03/2016   CL 103 01/03/2016   CREATININE 0.70 01/03/2016   BUN 12 01/03/2016   CO2 30 01/03/2016   TSH 1.12 01/03/2016   INR 1.05 08/20/2015    Lab Results  Component Value Date   TSH 1.12 01/03/2016   Lab Results  Component Value Date   WBC 6.2 01/03/2016   HGB 13.1 01/03/2016   HCT 39.0 01/03/2016   MCV 93.8 01/03/2016   PLT 267.0 01/03/2016   Lab Results  Component Value Date   NA 138 01/03/2016   K 3.5 01/03/2016   CO2 30 01/03/2016   GLUCOSE 80 01/03/2016   BUN 12 01/03/2016   CREATININE 0.70 01/03/2016   BILITOT 0.5 01/03/2016   ALKPHOS 58 01/03/2016   AST 14 01/03/2016   ALT 13 01/03/2016   PROT 7.3 01/03/2016   ALBUMIN 4.5  01/03/2016   CALCIUM 9.3 01/03/2016   CALCIUM 9.2 01/03/2016   ANIONGAP 8 10/29/2015   GFR 98.59 01/03/2016   Lab Results  Component Value Date   CHOL 190 09/02/2014   Lab Results  Component Value Date   HDL 58.10 09/02/2014   Lab Results  Component Value Date   LDLCALC 121 (H) 09/02/2014   Lab Results  Component Value Date   TRIG 53.0 09/02/2014   Lab Results  Component Value Date   CHOLHDL 3 09/02/2014   No results found for: HGBA1C     Assessment & Plan:   Problem List Items Addressed This Visit    Preventative health care (Chronic)    Patient encouraged to maintain heart healthy diet, regular exercise, adequate sleep. Consider daily probiotics. Take medications as prescribed. Labs reviewed. Pap today      Cervical cancer screening    Pap today, no concerns on exam.       Relevant Orders   Cytology - PAP (Completed)   HNP (herniated nucleus pulposus), lumbar    Pap today, no concerns on exam.       Spinal stenosis of lumbar region    S/p back surgery is doing better but still has some symptoms in left leg.      Sinus tachycardia (HCC)    Started Carvedilol today      Vitamin D deficiency    Not taking the Vitamin D regularly encouraged to do so      Relevant Orders   VITAMIN D 25 Hydroxy (Vit-D Deficiency, Fractures) (Completed)   Generalized anxiety disorder    Continue Lorazepam sparingly and continue with counselor and encouraged to establish with psychiatry as well if symptoms do not improve      Vitamin B 12 deficiency    Levels wnl      Relevant Orders   Vitamin B12 (Completed)   GERD (gastroesophageal reflux disease)    Avoid offending foods, start probiotics. Do not eat large meals in late evening and consider raising head of bed.        Other Visit Diagnoses  Tachycardia    -  Primary   Relevant Orders   TSH (Completed)   CBC (Completed)   T4, free (Completed)   Chronic diarrhea       Relevant Orders   Comprehensive  metabolic panel (Completed)   Vagina itching       Relevant Orders   Cervicovaginal ancillary only (Completed)   Hypocalcemia       Relevant Orders   PTH, Intact and Calcium (Completed)      I have discontinued Ms. Sewell's metoprolol succinate. I am also having her start on carvedilol. Additionally, I am having her maintain her GABAPENTIN PO and LORazepam.  Meds ordered this encounter  Medications  . carvedilol (COREG) 3.125 MG tablet    Sig: Take 1 tablet (3.125 mg total) by mouth 2 (two) times daily with a meal.    Dispense:  60 tablet    Refill:  3     Danise Edge, MD

## 2016-01-18 NOTE — Assessment & Plan Note (Signed)
Pap today, no concerns on exam.  

## 2016-01-18 NOTE — Assessment & Plan Note (Signed)
Continue Lorazepam sparingly and continue with counselor and encouraged to establish with psychiatry as well if symptoms do not improve

## 2016-01-20 ENCOUNTER — Other Ambulatory Visit: Payer: Self-pay | Admitting: Family Medicine

## 2016-01-20 MED ORDER — NEBIVOLOL HCL 5 MG PO TABS: 5.0000 mg | ORAL_TABLET | Freq: Every day | ORAL | 2 refills | Status: DC

## 2016-02-18 ENCOUNTER — Ambulatory Visit (INDEPENDENT_AMBULATORY_CARE_PROVIDER_SITE_OTHER): Payer: BLUE CROSS/BLUE SHIELD | Admitting: Family Medicine

## 2016-02-18 ENCOUNTER — Encounter: Payer: Self-pay | Admitting: Family Medicine

## 2016-02-18 VITALS — BP 119/93 | HR 78 | Temp 98.6°F | Ht 62.0 in | Wt 124.5 lb

## 2016-02-18 DIAGNOSIS — K219 Gastro-esophageal reflux disease without esophagitis: Secondary | ICD-10-CM | POA: Diagnosis not present

## 2016-02-18 DIAGNOSIS — G8929 Other chronic pain: Secondary | ICD-10-CM

## 2016-02-18 DIAGNOSIS — R000 Tachycardia, unspecified: Secondary | ICD-10-CM

## 2016-02-18 DIAGNOSIS — M79602 Pain in left arm: Secondary | ICD-10-CM | POA: Diagnosis not present

## 2016-02-18 DIAGNOSIS — R29898 Other symptoms and signs involving the musculoskeletal system: Secondary | ICD-10-CM

## 2016-02-18 DIAGNOSIS — M79605 Pain in left leg: Secondary | ICD-10-CM

## 2016-02-18 MED ORDER — CARVEDILOL 3.125 MG PO TABS: ORAL_TABLET | ORAL | 5 refills | Status: DC

## 2016-02-18 MED ORDER — GABAPENTIN 100 MG PO CAPS: 100.0000 mg | ORAL_CAPSULE | Freq: Every day | ORAL | 1 refills | Status: DC

## 2016-02-18 NOTE — Patient Instructions (Signed)
Nonspecific Tachycardia Tachycardia is a faster than normal heartbeat (more than 100 beats per minute). In adults, the heart normally beats between 60 and 100 times a minute. A fast heartbeat may be a normal response to exercise or stress. It does not necessarily mean that something is wrong. However, sometimes when your heart beats too fast it may not be able to pump enough blood to the rest of your body. This can result in chest pain, shortness of breath, dizziness, and even fainting. Nonspecific tachycardia means that the specific cause or pattern of your tachycardia is unknown. CAUSES  Tachycardia may be harmless or it may be due to a more serious underlying cause. Possible causes of tachycardia include:  Exercise or exertion.  Fever.  Pain or injury.  Infection.  Loss of body fluids (dehydration).  Overactive thyroid.  Lack of red blood cells (anemia).  Anxiety and stress.  Alcohol.  Caffeine.  Tobacco products.  Diet pills.  Illegal drugs.  Heart disease. SYMPTOMS  Rapid or irregular heartbeat (palpitations).  Suddenly feeling your heart beating (cardiac awareness).  Dizziness.  Tiredness (fatigue).  Shortness of breath.  Chest pain.  Nausea.  Fainting. DIAGNOSIS  Your caregiver will perform a physical exam and take your medical history. In some cases, a heart specialist (cardiologist) may be consulted. Your caregiver may also order:  Blood tests.  Electrocardiography. This test records the electrical activity of your heart.  A heart monitoring test. TREATMENT  Treatment will depend on the likely cause of your tachycardia. The goal is to treat the underlying cause of your tachycardia. Treatment methods may include:  Replacement of fluids or blood through an intravenous (IV) tube for moderate to severe dehydration or anemia.  New medicines or changes in your current medicines.  Diet and lifestyle changes.  Treatment for certain  infections.  Stress relief or relaxation methods. HOME CARE INSTRUCTIONS   Rest.  Drink enough fluids to keep your urine clear or pale yellow.  Do not smoke.  Avoid:  Caffeine.  Tobacco.  Alcohol.  Chocolate.  Stimulants such as over-the-counter diet pills or pills that help you stay awake.  Situations that cause anxiety or stress.  Illegal drugs such as marijuana, phencyclidine (PCP), and cocaine.  Only take medicine as directed by your caregiver.  Keep all follow-up appointments as directed by your caregiver. SEEK IMMEDIATE MEDICAL CARE IF:   You have pain in your chest, upper arms, jaw, or neck.  You become weak, dizzy, or feel faint.  You have palpitations that will not go away.  You vomit, have diarrhea, or pass blood in your stool.  Your skin is cool, pale, and wet.  You have a fever that will not go away with rest, fluids, and medicine. MAKE SURE YOU:   Understand these instructions.  Will watch your condition.  Will get help right away if you are not doing well or get worse.   This information is not intended to replace advice given to you by your health care provider. Make sure you discuss any questions you have with your health care provider.   Document Released: 07/06/2004 Document Revised: 08/21/2011 Document Reviewed: 12/11/2014 Elsevier Interactive Patient Education 2016 [employer] Inc[employer]  

## 2016-02-18 NOTE — Progress Notes (Signed)
Pre visit review using our clinic review tool, if applicable. No additional management support is needed unless otherwise documented below in the visit note. 

## 2016-02-22 ENCOUNTER — Encounter: Payer: Self-pay | Admitting: Family Medicine

## 2016-02-27 DIAGNOSIS — M79606 Pain in leg, unspecified: Secondary | ICD-10-CM

## 2016-02-27 DIAGNOSIS — G8929 Other chronic pain: Secondary | ICD-10-CM | POA: Insufficient documentation

## 2016-02-27 NOTE — Assessment & Plan Note (Addendum)
RRR today. Report recurrent symptoms. Doing well back on Carvedilol

## 2016-02-27 NOTE — Assessment & Plan Note (Signed)
Referred to PT due to persistent symptoms. Try moist heat and topical treatments

## 2016-02-27 NOTE — Progress Notes (Signed)
Patient ID: Wendy Harrell, female   DOB: 07-16-1975, 40 y.o.   MRN: [id number]    Subjective:    Patient ID: Wendy Harrell, female    DOB: [birth year], 40 y.o.   MRN: [id number]  Chief Complaint  Patient presents with  . Follow-up    HPI Patient is in today for follow up. She is feeling well today. She stopped Bystolic and switched back to Carvedilol and is doing well. Tachycardia well controlled. Denies CP/palp/SOB/HA/congestion/fevers/GI or GU c/o. Taking meds as prescribed  Past Medical History:  Diagnosis Date  . Cervical cancer screening 08/29/2013  . Chicken pox as a child  . Dermatitis 03/01/2011  . GERD (gastroesophageal reflux disease)   . GERD (gastroesophageal reflux disease) 01/18/2016  . History of echocardiogram    a. Echo 6/17: EF 55-60%, no RWMA, normal diastolic function, trivial MR, mild TR  . 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 Interstitial cystitis 03/01/2011  . Loss of weight 08/23/2014  . Raynaud's disease   . Sinus tachycardia (HCC)    Since her 20's.  Resting HR of 100-110 is not unusual  //  Event monitor 6/17: NSR, no sig arrhythmias   . Slipped cervical disc   . Vitamin B 12 deficiency 10/21/2015  . Vitamin D deficiency 10/07/2015    Past Surgical History:  Procedure Laterality Date  . cystoscopy  2001   benign  . laproscopy  2001  . LUMBAR LAMINECTOMY/DECOMPRESSION MICRODISCECTOMY Left 08/26/2015   Procedure: MICRO LUMBAR DECOMPRESSION  L5 - S1 ON LEFT;  Surgeon: Jene Every, MD;  Location: [facility];  Service: Orthopedics;  Laterality: Left;    Family History  Problem Relation Age of Onset  . Depression Father   . Heart disease Father   . Hypertension Father   . Hyperlipidemia Father   . Diabetes Father     type 2  . Arthritis Father   . Anxiety disorder Father   . Colon polyps Father   . Alcohol abuse Maternal Grandfather   . Depression Paternal Grandmother   . Alcohol abuse Paternal Grandmother   . Alcohol abuse Paternal Grandfather   . Heart  attack Paternal Grandfather   . Cancer Maternal Grandmother     skin cancer, breast cancer  . Gallbladder disease Maternal Grandmother   . Alzheimer's disease Maternal Aunt   . Hyperlipidemia Mother   . Hyperparathyroidism Mother   . Gallbladder disease Maternal Aunt     x4  . Gallbladder disease Maternal Uncle   . Colon cancer Neg Hx   . Esophageal cancer Neg Hx     Social History   Social History  . Marital status: Married    Spouse name: N/A  . Number of children: 1  . Years of education: N/A   Occupational History  . 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 Smoking status: Never Smoker  . Smokeless tobacco: Never Used  . Alcohol use No  . Drug use: No  . Sexual activity: Yes    Partners: Male    Birth control/ protection: Surgical   Other Topics Concern  . Not on file   Social History Narrative  . No narrative on file    Outpatient Medications Prior to Visit  Medication Sig Dispense Refill  . GABAPENTIN PO Take 100 mg by mouth at bedtime.     Marland Kitchen LORazepam (ATIVAN) 0.5 MG tablet Take 0.5 mg by mouth at bedtime.    . nebivolol (BYSTOLIC) 5 MG tablet Take 1 tablet (5 mg total)  by mouth daily. Patient failed metoprolol and carvedilol 30 tablet 2   No facility-administered medications prior to visit.     Allergies  Allergen Reactions  . Robaxin [Methocarbamol] Other (See Comments)    syncope  . Adhesive [Tape] Rash    Band-Aids gives pt a rash  . Sulfa Antibiotics Rash    Blisters in the mouth    Review of Systems  Constitutional: Negative for fever and malaise/fatigue.  HENT: Negative for congestion.   Eyes: Negative for blurred vision.  Respiratory: Negative for shortness of breath.   Cardiovascular: Positive for palpitations. Negative for chest pain and leg swelling.  Gastrointestinal: Negative for abdominal pain, blood in stool and nausea.  Genitourinary: Negative for dysuria and frequency.  Musculoskeletal: Negative for falls.  Skin:  Negative for rash.  Neurological: Negative for dizziness, loss of consciousness and headaches.  Endo/Heme/Allergies: Negative for environmental allergies.  Psychiatric/Behavioral: Negative for depression. The patient is nervous/anxious.        Objective:    Physical Exam  Constitutional: She is oriented to person, place, and time. She appears well-developed and well-nourished. No distress.  HENT:  Head: Normocephalic and atraumatic.  Nose: Nose normal.  Eyes: Right eye exhibits no discharge. Left eye exhibits no discharge.  Neck: Normal range of motion. Neck supple.  Cardiovascular: Normal rate and regular rhythm.   No murmur heard. Pulmonary/Chest: Effort normal and breath sounds normal.  Abdominal: Soft. Bowel sounds are normal. There is no tenderness.  Musculoskeletal: She exhibits no edema.  Neurological: She is alert and oriented to person, place, and time.  Skin: Skin is warm and dry.  Psychiatric: She has a normal mood and affect.  Nursing note and vitals reviewed.   BP (!) 119/93 (BP Location: Right Arm, Patient Position: Sitting, Cuff Size: Normal)   Pulse 78   Temp 98.6 F (37 C) (Oral)   Ht 5\' 2"  (1.575 m)   Wt 124 lb 8 oz (56.5 kg)   SpO2 100%   BMI 22.77 kg/m  Wt Readings from Last 3 Encounters:  02/18/16 124 lb 8 oz (56.5 kg)  01/03/16 116 lb 8 oz (52.8 kg)  12/16/15 119 lb 6.4 oz (54.2 kg)     Lab Results  Component Value Date   WBC 6.2 01/03/2016   HGB 13.1 01/03/2016   HCT 39.0 01/03/2016   PLT 267.0 01/03/2016   GLUCOSE 80 01/03/2016   CHOL 190 09/02/2014   TRIG 53.0 09/02/2014   HDL 58.10 09/02/2014   LDLCALC 121 (H) 09/02/2014   ALT 13 01/03/2016   AST 14 01/03/2016   NA 138 01/03/2016   K 3.5 01/03/2016   CL 103 01/03/2016   CREATININE 0.70 01/03/2016   BUN 12 01/03/2016   CO2 30 01/03/2016   TSH 1.12 01/03/2016   INR 1.05 08/20/2015    Lab Results  Component Value Date   TSH 1.12 01/03/2016   Lab Results  Component Value  Date   WBC 6.2 01/03/2016   HGB 13.1 01/03/2016   HCT 39.0 01/03/2016   MCV 93.8 01/03/2016   PLT 267.0 01/03/2016   Lab Results  Component Value Date   NA 138 01/03/2016   K 3.5 01/03/2016   CO2 30 01/03/2016   GLUCOSE 80 01/03/2016   BUN 12 01/03/2016   CREATININE 0.70 01/03/2016   BILITOT 0.5 01/03/2016   ALKPHOS 58 01/03/2016   AST 14 01/03/2016   ALT 13 01/03/2016   PROT 7.3 01/03/2016   ALBUMIN 4.5 01/03/2016  CALCIUM 9.3 01/03/2016   CALCIUM 9.2 01/03/2016   ANIONGAP 8 10/29/2015   GFR 98.59 01/03/2016   Lab Results  Component Value Date   CHOL 190 09/02/2014   Lab Results  Component Value Date   HDL 58.10 09/02/2014   Lab Results  Component Value Date   LDLCALC 121 (H) 09/02/2014   Lab Results  Component Value Date   TRIG 53.0 09/02/2014   Lab Results  Component Value Date   CHOLHDL 3 09/02/2014   No results found for: HGBA1C     Assessment & Plan:   Problem List Items Addressed This Visit    Sinus tachycardia (HCC)    RRR today. Report recurrent symptoms. Doing well back on Carvedilol      GERD (gastroesophageal reflux disease)    Avoid offending foods, start probiotics. Do not eat large meals in late evening and consider raising head of bed.       Chronic leg pain - Primary    Referred to PT due to persistent symptoms. Try moist heat and topical treatments      Relevant Orders   Ambulatory referral to Physical Therapy    Other Visit Diagnoses    Left leg weakness       Relevant Orders   Ambulatory referral to Physical Therapy      I have discontinued Ms. Enke's GABAPENTIN PO, LORazepam, nebivolol, and carvedilol. I am also having her start on carvedilol and gabapentin.  Meds ordered this encounter  Medications  . DISCONTD: carvedilol (COREG) 3.125 MG tablet    Refill:  3  . carvedilol (COREG) 3.125 MG tablet    Sig: 1/4 to 1/2 tab po bid    Dispense:  30 tablet    Refill:  5  . gabapentin (NEURONTIN) 100 MG capsule     Sig: Take 1 capsule (100 mg total) by mouth at bedtime.    Dispense:  90 capsule    Refill:  1     Kessa Fairbairn, MD

## 2016-02-27 NOTE — Assessment & Plan Note (Signed)
Avoid offending foods, start probiotics. Do not eat large meals in late evening and consider raising head of bed.  

## 2016-03-07 ENCOUNTER — Ambulatory Visit: Payer: BLUE CROSS/BLUE SHIELD | Admitting: Family Medicine

## 2016-03-17 ENCOUNTER — Encounter: Payer: Self-pay | Admitting: Family Medicine

## 2016-05-10 ENCOUNTER — Ambulatory Visit (INDEPENDENT_AMBULATORY_CARE_PROVIDER_SITE_OTHER): Payer: BLUE CROSS/BLUE SHIELD

## 2016-05-10 ENCOUNTER — Ambulatory Visit (INDEPENDENT_AMBULATORY_CARE_PROVIDER_SITE_OTHER): Payer: BLUE CROSS/BLUE SHIELD | Admitting: Podiatry

## 2016-05-10 ENCOUNTER — Encounter: Payer: Self-pay | Admitting: Podiatry

## 2016-05-10 DIAGNOSIS — B351 Tinea unguium: Secondary | ICD-10-CM

## 2016-05-10 DIAGNOSIS — M79671 Pain in right foot: Secondary | ICD-10-CM

## 2016-05-10 DIAGNOSIS — G629 Polyneuropathy, unspecified: Secondary | ICD-10-CM | POA: Diagnosis not present

## 2016-05-10 DIAGNOSIS — M79672 Pain in left foot: Secondary | ICD-10-CM

## 2016-05-10 DIAGNOSIS — M722 Plantar fascial fibromatosis: Secondary | ICD-10-CM | POA: Diagnosis not present

## 2016-05-10 MED ORDER — TRIAMCINOLONE ACETONIDE 10 MG/ML IJ SUSP
10.0000 mg | Freq: Once | INTRAMUSCULAR | Status: AC
  Administered 2016-05-10: 10 mg

## 2016-05-10 MED ORDER — TERBINAFINE HCL 250 MG PO TABS: ORAL_TABLET | ORAL | 0 refills | Status: DC

## 2016-05-10 NOTE — Progress Notes (Signed)
   Subjective:    Patient ID: Wendy Harrell, female    DOB: 1976-02-29, 40 y.o.   MRN: [id number]  HPI Chief Complaint  Patient presents with  . Foot Pain    Left foot; pt stated, "Having a nerve issue; nerve is in the bottom of the heel and burns into the pinky toe"      Review of Systems  All other systems reviewed and are negative.      Objective:   Physical Exam        Assessment & Plan:

## 2016-05-11 ENCOUNTER — Encounter: Payer: Self-pay | Admitting: Family Medicine

## 2016-05-11 LAB — HM MAMMOGRAPHY

## 2016-05-11 NOTE — Progress Notes (Signed)
Subjective:     Patient ID: Wendy Harrell, female   DOB: Nov 12, 1975, [age] y.o.   MRN: [id number]  HPI patient presents stating I have had pain in the plantar of my left heel with numbness and I did have back surgery in March and I have had problems with numbness of my left leg which is gradually getting better but my foot bothers me   Review of Systems  All other systems reviewed and are negative.      Objective:   Physical Exam  Constitutional: She is oriented to person, place, and time.  Cardiovascular: Intact distal pulses.   Musculoskeletal: Normal range of motion.  Neurological: She is oriented to person, place, and time.  Skin: Skin is warm.  Nursing note and vitals reviewed.  neurovascular status intact muscle strength adequate range of motion within normal limits with patient found to have discomfort in the plantar lateral left heel at the insertion with narrow heel and excessive stress occurring on the heel and into the arch. Patient did have diminished reflexes on the left Achilles tendon and is continuing to have some issues after having had her back decompression in March. Patient has good digital perfusion and is well oriented with normal muscle strength of the anterior tibial muscle     Assessment:     Combination of back issues with probable nerve pathology with patient noted to also have hopeful localized inflammatory fasciitis condition    Plan:     H&P condition reviewed and recommended careful injection which was administered 3 mg Kenalog 5 mill grams Xylocaine to the plantar lateral of the left heel with fascial brace administered. Gave instructions on heat ice therapy and we will do long-term Berkley type orthotics but first I want to make sure that we are reducing her symptoms  X-ray report indicates minimal spur formation with high arch foot type and narrow heel

## 2016-05-16 ENCOUNTER — Telehealth: Payer: Self-pay | Admitting: *Deleted

## 2016-05-16 NOTE — Telephone Encounter (Signed)
Pt states she is feeling great and the braces works well, she wanted to know if she still needed the appt on Thursday. 05/16/2016- Left message informing pt that if she felt good she could cancel the appt, but if Dr. Charlsie Merlesegal had offered orthotics to keep the appt, she may find they work better in more shoes.

## 2016-05-18 ENCOUNTER — Ambulatory Visit: Payer: BLUE CROSS/BLUE SHIELD | Admitting: Podiatry

## 2016-05-25 ENCOUNTER — Encounter: Payer: Self-pay | Admitting: Family Medicine

## 2016-05-25 ENCOUNTER — Ambulatory Visit: Payer: BLUE CROSS/BLUE SHIELD | Admitting: Family Medicine

## 2016-05-26 ENCOUNTER — Other Ambulatory Visit: Payer: Self-pay | Admitting: Family Medicine

## 2016-05-26 DIAGNOSIS — R000 Tachycardia, unspecified: Secondary | ICD-10-CM

## 2016-05-26 DIAGNOSIS — E538 Deficiency of other specified B group vitamins: Secondary | ICD-10-CM

## 2016-05-26 DIAGNOSIS — K219 Gastro-esophageal reflux disease without esophagitis: Secondary | ICD-10-CM

## 2016-05-26 DIAGNOSIS — E559 Vitamin D deficiency, unspecified: Secondary | ICD-10-CM

## 2016-06-02 ENCOUNTER — Other Ambulatory Visit (INDEPENDENT_AMBULATORY_CARE_PROVIDER_SITE_OTHER): Payer: BLUE CROSS/BLUE SHIELD

## 2016-06-02 DIAGNOSIS — K219 Gastro-esophageal reflux disease without esophagitis: Secondary | ICD-10-CM

## 2016-06-02 DIAGNOSIS — E559 Vitamin D deficiency, unspecified: Secondary | ICD-10-CM | POA: Diagnosis not present

## 2016-06-02 DIAGNOSIS — E538 Deficiency of other specified B group vitamins: Secondary | ICD-10-CM | POA: Diagnosis not present

## 2016-06-02 DIAGNOSIS — R000 Tachycardia, unspecified: Secondary | ICD-10-CM | POA: Diagnosis not present

## 2016-06-02 LAB — CBC
HEMATOCRIT: 39.1 % (ref 36.0–46.0)
Hemoglobin: 13.4 g/dL (ref 12.0–15.0)
MCHC: 34.3 g/dL (ref 30.0–36.0)
MCV: 93.5 fl (ref 78.0–100.0)
PLATELETS: 231 10*3/uL (ref 150.0–400.0)
RBC: 4.18 Mil/uL (ref 3.87–5.11)
RDW: 12.6 % (ref 11.5–15.5)
WBC: 4.1 10*3/uL (ref 4.0–10.5)

## 2016-06-02 LAB — COMPREHENSIVE METABOLIC PANEL
ALK PHOS: 52 U/L (ref 39–117)
ALT: 14 U/L (ref 0–35)
AST: 16 U/L (ref 0–37)
Albumin: 4.4 g/dL (ref 3.5–5.2)
BILIRUBIN TOTAL: 0.6 mg/dL (ref 0.2–1.2)
BUN: 9 mg/dL (ref 6–23)
CO2: 29 meq/L (ref 19–32)
Calcium: 9.5 mg/dL (ref 8.4–10.5)
Chloride: 104 mEq/L (ref 96–112)
Creatinine, Ser: 0.62 mg/dL (ref 0.40–1.20)
GFR: 113.17 mL/min (ref >60.00)
GLUCOSE: 96 mg/dL (ref 70–99)
POTASSIUM: 3.9 meq/L (ref 3.5–5.1)
SODIUM: 139 meq/L (ref 135–145)
TOTAL PROTEIN: 7.4 g/dL (ref 6.0–8.3)

## 2016-06-02 LAB — VITAMIN B12: VITAMIN B 12: 240 pg/mL (ref 211–911)

## 2016-06-02 LAB — LIPID PANEL
CHOL/HDL RATIO: 3
Cholesterol: 179 mg/dL (ref 0–200)
HDL: 63.3 mg/dL (ref >39.00)
LDL CALC: 107 mg/dL — AB (ref 0–99)
NonHDL: 116.02
Triglycerides: 46 mg/dL (ref 0.0–149.0)
VLDL: 9.2 mg/dL (ref 0.0–40.0)

## 2016-06-02 LAB — VITAMIN D 25 HYDROXY (VIT D DEFICIENCY, FRACTURES): VITD: 28.32 ng/mL — AB (ref 30.00–100.00)

## 2016-06-02 LAB — TSH: TSH: 1.32 u[IU]/mL (ref 0.35–4.50)

## 2016-06-03 LAB — INTRINSIC PATHWAY FACTORS
FACTOR IX ACTIVITY: 95 % (ref 60–177)
FACTOR VIII ACTIVITY: 89 % (ref 57–163)
FACTOR XI ACTIVITY: 134 % (ref 60–150)
FACTOR XII ACTIVITY: 111 % (ref 50–150)

## 2016-06-09 ENCOUNTER — Encounter: Payer: Self-pay | Admitting: Family Medicine

## 2016-06-09 ENCOUNTER — Ambulatory Visit (INDEPENDENT_AMBULATORY_CARE_PROVIDER_SITE_OTHER): Payer: BLUE CROSS/BLUE SHIELD | Admitting: Family Medicine

## 2016-06-09 VITALS — BP 102/60 | HR 86 | Temp 98.4°F | Wt 123.2 lb

## 2016-06-09 DIAGNOSIS — G8929 Other chronic pain: Secondary | ICD-10-CM

## 2016-06-09 DIAGNOSIS — E538 Deficiency of other specified B group vitamins: Secondary | ICD-10-CM

## 2016-06-09 DIAGNOSIS — G4709 Other insomnia: Secondary | ICD-10-CM

## 2016-06-09 DIAGNOSIS — G4700 Insomnia, unspecified: Secondary | ICD-10-CM

## 2016-06-09 DIAGNOSIS — E162 Hypoglycemia, unspecified: Secondary | ICD-10-CM

## 2016-06-09 DIAGNOSIS — E559 Vitamin D deficiency, unspecified: Secondary | ICD-10-CM | POA: Diagnosis not present

## 2016-06-09 DIAGNOSIS — F411 Generalized anxiety disorder: Secondary | ICD-10-CM

## 2016-06-09 DIAGNOSIS — R000 Tachycardia, unspecified: Secondary | ICD-10-CM

## 2016-06-09 DIAGNOSIS — M79605 Pain in left leg: Secondary | ICD-10-CM

## 2016-06-09 HISTORY — DX: Hypoglycemia, unspecified: E16.2

## 2016-06-09 HISTORY — DX: Insomnia, unspecified: G47.00

## 2016-06-09 NOTE — Assessment & Plan Note (Signed)
Has been seen with Podiatry and a brace is helping

## 2016-06-09 NOTE — Assessment & Plan Note (Addendum)
Encouraged to eat protein with each meal and needs to eat small frequent meals

## 2016-06-09 NOTE — Assessment & Plan Note (Signed)
Melatonin up to 20 mg and can consider L tyrptophan

## 2016-06-09 NOTE — Progress Notes (Signed)
Pre visit review using our clinic review tool, if applicable. No additional management support is needed unless otherwise documented below in the visit note. 

## 2016-06-09 NOTE — Assessment & Plan Note (Signed)
Mildly low encouraged daily supplements

## 2016-06-09 NOTE — Assessment & Plan Note (Addendum)
Better with carvedilol 1/4 tab bid

## 2016-06-09 NOTE — Progress Notes (Signed)
Subjective:    Patient ID: Wendy Harrell, female    DOB: [birth year], 40 y.o.   MRN: [id number]  Chief Complaint  Patient presents with  . Follow-up    HPI Patient is in today for a follow up. Wants to discuss possible Carvedilol and Gabapentin reduction.she is concerned that these meds are contributing to her increased anxiety. Denies CP/palp/SOB/HA/congestion/fevers/GI or GU c/o. Taking meds as prescribed. C/o some tingling/nimbness in feet. Worse since missing doses of Gabapentin.  Past Medical History:  Diagnosis Date  . Cervical cancer screening 08/29/2013  . Chicken pox as a child  . Dermatitis 03/01/2011  . GERD (gastroesophageal reflux disease)   . GERD (gastroesophageal reflux disease) 01/18/2016  . History of echocardiogram    a. Echo 6/17: EF 55-60%, no RWMA, normal diastolic function, trivial MR, mild TR  . 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 Hypoglycemia 06/09/2016  . Insomnia 06/09/2016  . Interstitial cystitis 03/01/2011  . Loss of weight 08/23/2014  . Raynaud's disease   . Sinus tachycardia    Since her 20's.  Resting HR of 100-110 is not unusual  //  Event monitor 6/17: NSR, no sig arrhythmias   . Slipped cervical disc   . Vitamin B 12 deficiency 10/21/2015  . Vitamin D deficiency 10/07/2015    Past Surgical History:  Procedure Laterality Date  . cystoscopy  2001   benign  . laproscopy  2001  . LUMBAR LAMINECTOMY/DECOMPRESSION MICRODISCECTOMY Left 08/26/2015   Procedure: MICRO LUMBAR DECOMPRESSION  L5 - S1 ON LEFT;  Surgeon: Jene EveryJeffrey Beane, MD;  Location: [facility];  Service: Orthopedics;  Laterality: Left;    Family History  Problem Relation Age of Onset  . Depression Father   . Heart disease Father   . Hypertension Father   . Hyperlipidemia Father   . Diabetes Father     type 2  . Arthritis Father   . Anxiety disorder Father   . Colon polyps Father   . Alcohol abuse Maternal Grandfather   . Depression Paternal Grandmother   . Alcohol abuse Paternal  Grandmother   . Alcohol abuse Paternal Grandfather   . Heart attack Paternal Grandfather   . Cancer Maternal Grandmother     skin cancer, breast cancer  . Gallbladder disease Maternal Grandmother   . Alzheimer's disease Maternal Aunt   . Hyperlipidemia Mother   . Hyperparathyroidism Mother   . Gallbladder disease Maternal Aunt     x4  . Gallbladder disease Maternal Uncle   . Colon cancer Neg Hx   . Esophageal cancer Neg Hx     Social History   Social History  . Marital status: Married    Spouse name: N/A  . Number of children: 1  . Years of education: N/A   Occupational History  . 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 Smoking status: Never Smoker  . Smokeless tobacco: Never Used  . Alcohol use No  . Drug use: No  . Sexual activity: Yes    Partners: Male    Birth control/ protection: Surgical   Other Topics Concern  . Not on file   Social History Narrative  . No narrative on file    Outpatient Medications Prior to Visit  Medication Sig Dispense Refill  . carvedilol (COREG) 3.125 MG tablet 1/4 to 1/2 tab po bid 30 tablet 5  . gabapentin (NEURONTIN) 100 MG capsule Take 1 capsule (100 mg total) by mouth at bedtime. 90 capsule 1  . terbinafine (LAMISIL) 250 MG  tablet Please take one a day x 7days, repeat every 4 weeks x 4 months (Patient not taking: Reported on 06/09/2016) 28 tablet 0   No facility-administered medications prior to visit.     Allergies  Allergen Reactions  . Robaxin [Methocarbamol] Other (See Comments)    syncope  . Adhesive [Tape] Rash    Band-Aids gives pt a rash  . Sulfa Antibiotics Rash    Blisters in the mouth    Review of Systems  Constitutional: Negative for fever and malaise/fatigue.  HENT: Negative for congestion.   Eyes: Negative for blurred vision.  Respiratory: Negative for shortness of breath.   Cardiovascular: Negative for chest pain, palpitations and leg swelling.  Gastrointestinal: Negative for abdominal pain,  blood in stool and nausea.  Genitourinary: Negative for dysuria and frequency.  Musculoskeletal: Negative for falls.  Skin: Negative for rash.  Neurological: Positive for tingling. Negative for dizziness, loss of consciousness and headaches.  Endo/Heme/Allergies: Negative for environmental allergies.  Psychiatric/Behavioral: Negative for depression. The patient is not nervous/anxious.        Objective:    Physical Exam  Constitutional: She is oriented to person, place, and time. She appears well-developed and well-nourished. No distress.  HENT:  Head: Normocephalic and atraumatic.  Nose: Nose normal.  Eyes: Right eye exhibits no discharge. Left eye exhibits no discharge.  Neck: Normal range of motion. Neck supple.  Cardiovascular: Normal rate and regular rhythm.   No murmur heard. Pulmonary/Chest: Effort normal and breath sounds normal.  Abdominal: Soft. Bowel sounds are normal. There is no tenderness.  Musculoskeletal: She exhibits no edema.  Neurological: She is alert and oriented to person, place, and time.  Skin: Skin is warm and dry.  Psychiatric: She has a normal mood and affect.  Nursing note and vitals reviewed.   BP 102/60 (BP Location: Left Arm, Patient Position: Sitting, Cuff Size: Normal)   Pulse 86   Temp 98.4 F (36.9 C) (Oral)   Wt 123 lb 3.2 oz (55.9 kg)   LMP 06/02/2016   SpO2 95% Comment: RA  BMI 22.53 kg/m  Wt Readings from Last 3 Encounters:  06/09/16 123 lb 3.2 oz (55.9 kg)  02/18/16 124 lb 8 oz (56.5 kg)  01/03/16 116 lb 8 oz (52.8 kg)     Lab Results  Component Value Date   WBC 4.1 06/02/2016   HGB 13.4 06/02/2016   HCT 39.1 06/02/2016   PLT 231.0 06/02/2016   GLUCOSE 96 06/02/2016   CHOL 179 06/02/2016   TRIG 46.0 06/02/2016   HDL 63.30 06/02/2016   LDLCALC 107 (H) 06/02/2016   ALT 14 06/02/2016   AST 16 06/02/2016   NA 139 06/02/2016   K 3.9 06/02/2016   CL 104 06/02/2016   CREATININE 0.62 06/02/2016   BUN 9 06/02/2016   CO2 29  06/02/2016   TSH 1.32 06/02/2016   INR 1.05 08/20/2015    Lab Results  Component Value Date   TSH 1.32 06/02/2016   Lab Results  Component Value Date   WBC 4.1 06/02/2016   HGB 13.4 06/02/2016   HCT 39.1 06/02/2016   MCV 93.5 06/02/2016   PLT 231.0 06/02/2016   Lab Results  Component Value Date   NA 139 06/02/2016   K 3.9 06/02/2016   CO2 29 06/02/2016   GLUCOSE 96 06/02/2016   BUN 9 06/02/2016   CREATININE 0.62 06/02/2016   BILITOT 0.6 06/02/2016   ALKPHOS 52 06/02/2016   AST 16 06/02/2016   ALT 14 06/02/2016  PROT 7.4 06/02/2016   ALBUMIN 4.4 06/02/2016   CALCIUM 9.5 06/02/2016   ANIONGAP 8 10/29/2015   GFR 113.17 06/02/2016   Lab Results  Component Value Date   CHOL 179 06/02/2016   Lab Results  Component Value Date   HDL 63.30 06/02/2016   Lab Results  Component Value Date   LDLCALC 107 (H) 06/02/2016   Lab Results  Component Value Date   TRIG 46.0 06/02/2016   Lab Results  Component Value Date   CHOLHDL 3 06/02/2016   No results found for: HGBA1C    I acted as a Neurosurgeon for Dr. Abner Greenspan. Diamond Nickel, RMA  Assessment & Plan:   Problem List Items Addressed This Visit    Sinus tachycardia    Better with carvedilol 1/4 tab bid      Vitamin D deficiency    Mildly low encouraged daily supplements      Relevant Orders   Vitamin D (25 hydroxy)   Generalized anxiety disorder    Has felt more anxious lately and tried stopping meds to see if that would help but it did not.       Chronic leg pain    Has been seen with Podiatry and a brace is helping      Hypoglycemia    Encouraged to eat protein with each meal and needs to eat small frequent meals      Insomnia    Melatonin up to 20 mg and can consider L tyrptophan       Other Visit Diagnoses    Vitamin B12 deficiency    -  Primary   Relevant Orders   Vitamin B12      I have discontinued Ms. Alles's terbinafine. I am also having her maintain her carvedilol and gabapentin.  No  orders of the defined types were placed in this encounter.   CMA served as Neurosurgeon during this visit. History, Physical and Plan performed by medical provider. Documentation and orders reviewed and attested to.  Danise Edge, MD

## 2016-06-09 NOTE — Progress Notes (Signed)
Patient ID: Wendy Harrell, female   DOB: 20-Apr-1976, 40 y.o.   MRN: [id number]

## 2016-06-09 NOTE — Patient Instructions (Signed)
Sinus Tachycardia Sinus tachycardia is a kind of fast heartbeat. In sinus tachycardia, the heart beats more than 100 times a minute. Sinus tachycardia starts in a part of the heart called the sinus node. Sinus tachycardia may be harmless, or it may be a sign of a serious condition. What are the causes? This condition may be caused by:  Exercise or exertion.  A fever.  Pain.  Loss of body fluids (dehydration).  Severe bleeding (hemorrhage).  Anxiety and stress.  Certain substances, including:  Alcohol.  Caffeine.  Tobacco and nicotine products.  Diet pills.  Illegal drugs.  Medical conditions including:  Heart disease.  An infection.  An overactive thyroid (hyperthyroidism).  A lack of red blood cells (anemia). What are the signs or symptoms? Symptoms of this condition include:  A feeling that the heart is beating quickly (palpitations).  Suddenly noticing your heartbeat (cardiac awareness).  Dizziness.  Tiredness (fatigue).  Shortness of breath.  Chest pain.  Nausea.  Fainting. How is this diagnosed? This condition is diagnosed with:  A physical exam.  Other tests, such as:  Blood tests.  An electrocardiogram (ECG). This test measures the electrical activity of the heart.  Holter monitoring. For this test, you wear a device that records your heartbeat for one or more days. You may be referred to a heart specialist (cardiologist). How is this treated? Treatment for this condition depends on the cause or underlying condition. Treatment may involve:  Treating the underlying condition.  Taking new medicines or changing your current medicines as told by your health care provider.  Making changes to your diet or lifestyle.  Practicing relaxation methods. Follow these instructions at home: Lifestyle   Do not use any products that contain nicotine or tobacco, such as cigarettes and e-cigarettes. If you need help quitting, ask your health care  provider.  Learn relaxation methods, like deep breathing, to help you when you get stressed or anxious.  Do not use illegal drugs, such as cocaine.  Do not abuse alcohol. Limit alcohol intake to no more than 1 drink a day for non-pregnant women and 2 drinks a day for men. One drink equals 12 oz of beer, 5 oz of wine, or 1 oz of hard liquor.  Find time to rest and relax often. This reduces stress.  Avoid:  Caffeine.  Stimulants such as over-the-counter diet pills or pills that help you to stay awake.  Situations that cause anxiety or stress. General instructions   Drink enough fluids to keep your urine clear or pale yellow.  Take over-the-counter and prescription medicines only as told by your health care provider.  Keep all follow-up visits as told by your health care provider. This is important. Contact a health care provider if:  You have a fever.  You have vomiting or diarrhea that keeps happening (is persistent). Get help right away if:  You have pain in your chest, upper arms, jaw, or neck.  You become weak or dizzy.  You feel faint.  You have palpitations that do not go away. This information is not intended to replace advice given to you by your health care provider. Make sure you discuss any questions you have with your health care provider. Document Released: 07/06/2004 Document Revised: 12/25/2015 Document Reviewed: 12/11/2014 Elsevier Interactive Patient Education  2017 [employer]  

## 2016-06-14 NOTE — Assessment & Plan Note (Signed)
Has felt more anxious lately and tried stopping meds to see if that would help but it did not.

## 2016-06-15 ENCOUNTER — Encounter: Payer: Self-pay | Admitting: Family Medicine

## 2016-06-19 ENCOUNTER — Ambulatory Visit: Payer: BLUE CROSS/BLUE SHIELD | Admitting: Family Medicine

## 2016-07-18 ENCOUNTER — Other Ambulatory Visit: Payer: Self-pay | Admitting: Family Medicine

## 2016-07-31 ENCOUNTER — Encounter: Payer: Self-pay | Admitting: Family Medicine

## 2016-08-03 ENCOUNTER — Other Ambulatory Visit: Payer: Self-pay | Admitting: Family Medicine

## 2016-08-03 MED ORDER — GABAPENTIN 100 MG PO CAPS: 100.0000 mg | ORAL_CAPSULE | Freq: Every day | ORAL | 1 refills | Status: DC

## 2016-10-13 ENCOUNTER — Encounter: Payer: Self-pay | Admitting: Family Medicine

## 2016-10-25 ENCOUNTER — Encounter: Payer: Self-pay | Admitting: Family Medicine

## 2016-10-25 LAB — HM MAMMOGRAPHY

## 2016-10-30 ENCOUNTER — Encounter: Payer: Self-pay | Admitting: Family Medicine

## 2016-12-06 ENCOUNTER — Telehealth: Payer: Self-pay | Admitting: Family Medicine

## 2016-12-06 ENCOUNTER — Telehealth: Payer: Self-pay

## 2016-12-06 ENCOUNTER — Ambulatory Visit: Payer: Self-pay | Admitting: Family Medicine

## 2016-12-06 NOTE — Telephone Encounter (Signed)
Patient called [facility] to confirm appointment. She feels like she is having an anxiety attack but wants to be "checked out". She called [place] office & held for 20 minutes and hung up. She said she will try to call them later.

## 2016-12-06 NOTE — Telephone Encounter (Signed)
Patient Name: Wendy Harrell DOB: 1975/09/13 Initial Comment [name] states she's having chest pains, she has been weaning off heart medication. Nurse Assessment Nurse: Yetta BarreJones, RN, Miranda Date/Time (Eastern Time): 12/06/2016 12:04:15 PM Confirm and document reason for call. If symptomatic, describe symptoms. ---[name] states she stopped taking Coreg 10 days ago and today while using the swifer, she developed chest pain. The pain is over the left side and does not get worse with movement. She had similar symptoms last time she tried to wean from the med. Does the patient have any new or worsening symptoms? ---Yes Will a triage be completed? ---Yes Related visit to physician within the last 2 weeks? ---No Does the PT have any chronic conditions? (i.e. diabetes, asthma, etc.) ---Yes List chronic conditions. ---Hx of Tachycardia, Back surgery/herniated disc Is the patient pregnant or possibly pregnant? (Ask all females between the ages of 5212-55) ---No Is this a behavioral health or substance abuse call? ---No Guidelines Guideline Title Affirmed Question Affirmed Notes Chest Pain Chest pain lasts > 5 minutes (Exceptions: chest pain occurring > 3 days ago and now asymptomatic; same as previously diagnosed heartburn and has accompanying sour taste in mouth) Final Disposition User Go to ED Now (or PCP triage) Yetta BarreJones, RN, Miranda Comments No appt available at primary office, Appt scheduled for 4pm with Dr. Claiborne BillingsKuneff at [facility]. Referrals GO TO FACILITY OTHER - SPECIFY Disagree/Comply: Comply

## 2016-12-06 NOTE — Telephone Encounter (Signed)
Patient states she will go to UC to have Traponin level drawn today.

## 2016-12-06 NOTE — Telephone Encounter (Signed)
Spoke with patient regarding symptoms. Patient states while "swiffering" the floor this morning, she twisted in an awkward position and has been experiencing chest discomfort since. Patient denies shortness of breath, clamminess, or nausea. Patient has recently weaned off of Coreg so she wants to get "checked out". Strongly encouraged patient to go to the Emergency Department for evaluation, patient refuses due to $10K deductible.   **Since speaking with patient, Judie GrieveSuzanne Williams, LPN has spoken to patient. She has agreed to go to an Urgent Care**

## 2016-12-06 NOTE — Telephone Encounter (Signed)
Follow up call made to patient regarding call made to team health. Left message for return call.

## 2016-12-06 NOTE — Telephone Encounter (Signed)
Request records if she went to an outside UC and then we can refer to cardiology for further testing if she is willing.

## 2016-12-06 NOTE — Telephone Encounter (Signed)
Patient called here and had questions about are availability to get x-ray and troponin levels here explained to patient we do not do xray here and our labs are sent out. Patient states she feels like she needs a cardiac work up due to her history. She states she will go to an UC and follow up with Dr Abner GreenspanBlyth as needed.patient requested cancellation of appt today with Dr Claiborne BillingsKuneff.

## 2016-12-07 ENCOUNTER — Telehealth: Payer: Self-pay

## 2016-12-07 NOTE — Telephone Encounter (Signed)
If she has chest pain with or without sob, palpitaitons, diaphoresis or nausea, she should go to get looked at

## 2016-12-07 NOTE — Telephone Encounter (Signed)
Please see telephone note From Marylouise StacksKaren Jenika Chiem, Patient stated she did not got to urgent care.   pc

## 2016-12-07 NOTE — Telephone Encounter (Signed)
Please read message below.  PC

## 2016-12-07 NOTE — Telephone Encounter (Signed)
Follow up call made. Patient states she did not go to UC took Advil instead per advice for her friend. for her pain and this helped. [facility] the [facility]

## 2016-12-07 NOTE — Telephone Encounter (Signed)
Follow up call made to patient. States she was advised to take Advil from a friend and it relieved her chest pain on yesterday. States she did not go to UC to have Triponin level drawn. Advised patient to go to ED or UC if chest pain or SOB  returned.   Patient agreed.

## 2016-12-08 NOTE — Telephone Encounter (Signed)
Tried to call patient, no answer. Left message for pt to call back.  pc

## 2017-03-12 ENCOUNTER — Ambulatory Visit (INDEPENDENT_AMBULATORY_CARE_PROVIDER_SITE_OTHER): Payer: BLUE CROSS/BLUE SHIELD | Admitting: Family Medicine

## 2017-03-12 ENCOUNTER — Encounter: Payer: Self-pay | Admitting: Family Medicine

## 2017-03-12 DIAGNOSIS — E162 Hypoglycemia, unspecified: Secondary | ICD-10-CM | POA: Diagnosis not present

## 2017-03-12 DIAGNOSIS — F411 Generalized anxiety disorder: Secondary | ICD-10-CM | POA: Diagnosis not present

## 2017-03-12 DIAGNOSIS — E538 Deficiency of other specified B group vitamins: Secondary | ICD-10-CM | POA: Diagnosis not present

## 2017-03-12 DIAGNOSIS — R197 Diarrhea, unspecified: Secondary | ICD-10-CM | POA: Diagnosis not present

## 2017-03-12 DIAGNOSIS — E559 Vitamin D deficiency, unspecified: Secondary | ICD-10-CM

## 2017-03-12 DIAGNOSIS — R000 Tachycardia, unspecified: Secondary | ICD-10-CM

## 2017-03-12 MED ORDER — FLUOXETINE HCL 20 MG PO TABS: 10.0000 mg | ORAL_TABLET | Freq: Every day | ORAL | 3 refills | Status: DC

## 2017-03-12 NOTE — Progress Notes (Signed)
Subjective:  I acted as a Neurosurgeon for Dr. Abner Greenspan. [place], [place]  Patient ID: Wendy Harrell, female    DOB: 12-May-1976, 41 y.o.   MRN: [id number]  No chief complaint on file.   HPI  Patient is in today for a follow up. No recent febrile Illness or hospitalization but she is struggling with ongoing anxiety and multiple stressors at home. Her 38-year-old daughter has sensory deficit issues and anxiety disorder. Her marriage is also struggling and as a result her health has been struggling. She has 2-3 loose stools daily significant anxiety and occasional palpitations. She has difficulty concentrating and worries about even small things regularly. Notes anhedonia but denies any suicidal ideation. Denies CP/palp/SOB/HA/congestion/fevers or GU c/o. Taking meds as prescribed. No bloody or tarry stool  Patient Care Team: Bradd Canary, MD as PCP - General (Family Medicine)   Past Medical History:  Diagnosis Date  . Cervical cancer screening 08/29/2013  . Chicken pox as a child  . Dermatitis 03/01/2011  . GERD (gastroesophageal reflux disease)   . GERD (gastroesophageal reflux disease) 01/18/2016  . History of echocardiogram    a. Echo 6/17: EF 55-60%, no RWMA, normal diastolic function, trivial MR, mild TR  . 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 Hypoglycemia 06/09/2016  . Insomnia 06/09/2016  . Interstitial cystitis 03/01/2011  . Loss of weight 08/23/2014  . Raynaud's disease   . Sinus tachycardia    Since her 20's.  Resting HR of 100-110 is not unusual  //  Event monitor 6/17: NSR, no sig arrhythmias   . Slipped cervical disc   . Vitamin B 12 deficiency 10/21/2015  . Vitamin D deficiency 10/07/2015    Past Surgical History:  Procedure Laterality Date  . cystoscopy  2001   benign  . laproscopy  2001  . LUMBAR LAMINECTOMY/DECOMPRESSION MICRODISCECTOMY Left 08/26/2015   Procedure: MICRO LUMBAR DECOMPRESSION  L5 - S1 ON LEFT;  Surgeon: Jene Every, MD;  Location: [facility];  Service: Orthopedics;   Laterality: Left;    Family History  Problem Relation Age of Onset  . Depression Father   . Heart disease Father   . Hypertension Father   . Hyperlipidemia Father   . Diabetes Father        type 2  . Arthritis Father   . Anxiety disorder Father   . Colon polyps Father   . Alcohol abuse Maternal Grandfather   . Depression Paternal Grandmother   . Alcohol abuse Paternal Grandmother   . Alcohol abuse Paternal Grandfather   . Heart attack Paternal Grandfather   . Cancer Maternal Grandmother        skin cancer, breast cancer  . Gallbladder disease Maternal Grandmother   . Alzheimer's disease Maternal Aunt   . Hyperlipidemia Mother   . Hyperparathyroidism Mother   . Gallbladder disease Maternal Aunt        x4  . Gallbladder disease Maternal Uncle   . Colon cancer Neg Hx   . Esophageal cancer Neg Hx     Social History   Social History  . Marital status: Married    Spouse name: N/A  . Number of children: 1  . Years of education: N/A   Occupational History  . 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 Smoking status: Never Smoker  . Smokeless tobacco: Never Used  . Alcohol use No  . Drug use: No  . Sexual activity: Yes    Partners: Male    Birth control/ protection: Surgical  Other Topics Concern  . Not on file   Social History Narrative  . No narrative on file    Outpatient Medications Prior to Visit  Medication Sig Dispense Refill  . carvedilol (COREG) 3.125 MG tablet TAKE 1/4 TO 1/2 TABLET BY MOUTH TWICE A DAY 30 tablet 2  . gabapentin (NEURONTIN) 100 MG capsule Take 1 capsule (100 mg total) by mouth at bedtime. 90 capsule 1   No facility-administered medications prior to visit.     Allergies  Allergen Reactions  . Robaxin [Methocarbamol] Other (See Comments)    syncope  . Adhesive [Tape] Rash    Band-Aids gives pt a rash  . Sulfa Antibiotics Rash    Blisters in the mouth    Review of Systems  Constitutional: Negative for fever and  malaise/fatigue.  HENT: Negative for congestion.   Eyes: Negative for blurred vision.  Respiratory: Negative for cough and shortness of breath.   Cardiovascular: Positive for palpitations. Negative for chest pain and leg swelling.  Gastrointestinal: Positive for diarrhea. Negative for vomiting.  Musculoskeletal: Negative for back pain.  Skin: Negative for rash.  Neurological: Negative for loss of consciousness and headaches.  Psychiatric/Behavioral: The patient is nervous/anxious.        Objective:    Physical Exam  Constitutional: She is oriented to person, place, and time. She appears well-developed and well-nourished. No distress.  HENT:  Head: Normocephalic and atraumatic.  Eyes: Conjunctivae are normal.  Neck: Normal range of motion. No thyromegaly present.  Cardiovascular: Regular rhythm.   tachycardia  Pulmonary/Chest: Effort normal and breath sounds normal. She has no wheezes.  Abdominal: Soft. Bowel sounds are normal. There is no tenderness.  Musculoskeletal: Normal range of motion. She exhibits no edema or deformity.  Neurological: She is alert and oriented to person, place, and time.  Skin: Skin is warm and dry. She is not diaphoretic.  Psychiatric: She has a normal mood and affect.    BP 110/60 (BP Location: Left Arm, Patient Position: Sitting, Cuff Size: Normal)   Pulse 94   Temp (!) 97.5 F (36.4 C) (Oral)   Resp 18   Wt 130 lb 12.8 oz (59.3 kg)   SpO2 98%   BMI 23.92 kg/m  Wt Readings from Last 3 Encounters:  03/12/17 130 lb 12.8 oz (59.3 kg)  06/09/16 123 lb 3.2 oz (55.9 kg)  02/18/16 124 lb 8 oz (56.5 kg)   BP Readings from Last 3 Encounters:  03/12/17 110/60  06/09/16 102/60  02/18/16 (!) 119/93     Immunization History  Administered Date(s) Administered  . Influenza-Unspecified 03/12/2014    Health Maintenance  Topic Date Due  . TETANUS/TDAP  03/06/1995  . INFLUENZA VACCINE  09/09/2017 (Originally 01/10/2017)  . PAP SMEAR  01/03/2019  .  HIV Screening  Completed    Lab Results  Component Value Date   WBC 6.6 03/12/2017   HGB 13.0 03/12/2017   HCT 38.9 03/12/2017   PLT 280.0 03/12/2017   GLUCOSE 86 03/12/2017   CHOL 179 06/02/2016   TRIG 46.0 06/02/2016   HDL 63.30 06/02/2016   LDLCALC 107 (H) 06/02/2016   ALT 11 03/12/2017   AST 13 03/12/2017   NA 142 03/12/2017   K 4.0 03/12/2017   CL 104 03/12/2017   CREATININE 0.57 03/12/2017   BUN 7 03/12/2017   CO2 29 03/12/2017   TSH 3.01 03/12/2017   INR 1.05 08/20/2015    Lab Results  Component Value Date   TSH 3.01 03/12/2017  Lab Results  Component Value Date   WBC 6.6 03/12/2017   HGB 13.0 03/12/2017   HCT 38.9 03/12/2017   MCV 96.2 03/12/2017   PLT 280.0 03/12/2017   Lab Results  Component Value Date   NA 142 03/12/2017   K 4.0 03/12/2017   CO2 29 03/12/2017   GLUCOSE 86 03/12/2017   BUN 7 03/12/2017   CREATININE 0.57 03/12/2017   BILITOT 0.4 03/12/2017   ALKPHOS 61 03/12/2017   AST 13 03/12/2017   ALT 11 03/12/2017   PROT 7.3 03/12/2017   ALBUMIN 4.5 03/12/2017   CALCIUM 9.6 03/12/2017   ANIONGAP 8 10/29/2015   GFR 124.22 03/12/2017   Lab Results  Component Value Date   CHOL 179 06/02/2016   Lab Results  Component Value Date   HDL 63.30 06/02/2016   Lab Results  Component Value Date   LDLCALC 107 (H) 06/02/2016   Lab Results  Component Value Date   TRIG 46.0 06/02/2016   Lab Results  Component Value Date   CHOLHDL 3 06/02/2016   No results found for: HGBA1C       Assessment & Plan:   Problem List Items Addressed This Visit    Diarrhea    2-3 loose stool most days, avoid offending foods, add Benefiber bid and probiotics      Relevant Orders   CBC (Completed)   TSH (Completed)   Sinus tachycardia    Mild asymptomatic, monitor and report concerning symptoms      Vitamin D deficiency    Still slightly low, increase supplements by 1000 IU daily      Relevant Orders   VITAMIN D 25 Hydroxy (Vit-D Deficiency,  Fractures) (Completed)   Generalized anxiety disorder    Is struggling with a child who has sensory issue at age 40 who has anxiety d/o and a difficult marriage. Will start Fluoxetine 20 mg daily and reassess, can start with 10 mg daily      Relevant Medications   FLUoxetine (PROZAC) 20 MG tablet   Vitamin B 12 deficiency    Level wnl today, no changes      Relevant Orders   Vitamin B12 (Completed)   Hypoglycemia    Check cmp      Relevant Orders   Comprehensive metabolic panel (Completed)      I have discontinued Ms. Santillano's carvedilol and gabapentin. I am also having her start on FLUoxetine.  Meds ordered this encounter  Medications  . FLUoxetine (PROZAC) 20 MG tablet    Sig: Take 0.5-1 tablets (10-20 mg total) by mouth daily.    Dispense:  30 tablet    Refill:  3    CMA served as scribe during this visit. History, Physical and Plan performed by medical provider. Documentation and orders reviewed and attested to.  Danise Edge, MD

## 2017-03-12 NOTE — Assessment & Plan Note (Signed)
Check cmp 

## 2017-03-12 NOTE — Assessment & Plan Note (Addendum)
Is struggling with a child who has sensory issue at age 41 who has anxiety d/o and a difficult marriage. Will start Fluoxetine 20 mg daily and reassess, can start with 10 mg daily

## 2017-03-12 NOTE — Assessment & Plan Note (Addendum)
Still slightly low, increase supplements by 1000 IU daily

## 2017-03-12 NOTE — Patient Instructions (Signed)

## 2017-03-12 NOTE — Assessment & Plan Note (Addendum)
2-3 loose stool most days, avoid offending foods, add Benefiber bid and probiotics

## 2017-03-12 NOTE — Assessment & Plan Note (Addendum)
Level wnl today, no changes

## 2017-03-13 ENCOUNTER — Ambulatory Visit: Payer: BLUE CROSS/BLUE SHIELD | Admitting: Family Medicine

## 2017-03-13 LAB — CBC
HEMATOCRIT: 38.9 % (ref 36.0–46.0)
Hemoglobin: 13 g/dL (ref 12.0–15.0)
MCHC: 33.4 g/dL (ref 30.0–36.0)
MCV: 96.2 fl (ref 78.0–100.0)
PLATELETS: 280 10*3/uL (ref 150.0–400.0)
RBC: 4.04 Mil/uL (ref 3.87–5.11)
RDW: 12.3 % (ref 11.5–15.5)
WBC: 6.6 10*3/uL (ref 4.0–10.5)

## 2017-03-13 LAB — COMPREHENSIVE METABOLIC PANEL
ALK PHOS: 61 U/L (ref 39–117)
ALT: 11 U/L (ref 0–35)
AST: 13 U/L (ref 0–37)
Albumin: 4.5 g/dL (ref 3.5–5.2)
BILIRUBIN TOTAL: 0.4 mg/dL (ref 0.2–1.2)
BUN: 7 mg/dL (ref 6–23)
CALCIUM: 9.6 mg/dL (ref 8.4–10.5)
CO2: 29 meq/L (ref 19–32)
CREATININE: 0.57 mg/dL (ref 0.40–1.20)
Chloride: 104 mEq/L (ref 96–112)
GFR: 124.22 mL/min (ref >60.00)
GLUCOSE: 86 mg/dL (ref 70–99)
Potassium: 4 mEq/L (ref 3.5–5.1)
Sodium: 142 mEq/L (ref 135–145)
TOTAL PROTEIN: 7.3 g/dL (ref 6.0–8.3)

## 2017-03-13 LAB — TSH: TSH: 3.01 u[IU]/mL (ref 0.35–4.50)

## 2017-03-13 LAB — VITAMIN B12: VITAMIN B 12: 228 pg/mL (ref 211–911)

## 2017-03-13 LAB — VITAMIN D 25 HYDROXY (VIT D DEFICIENCY, FRACTURES): VITD: 29.09 ng/mL — ABNORMAL LOW (ref 30.00–100.00)

## 2017-03-14 NOTE — Assessment & Plan Note (Signed)
Mild asymptomatic, monitor and report concerning symptoms

## 2017-03-29 ENCOUNTER — Telehealth: Payer: Self-pay | Admitting: *Deleted

## 2017-03-29 NOTE — Telephone Encounter (Signed)
[facility] forwarded to provider/SLS 10/18

## 2017-04-02 IMAGING — DX DG SPINE 1V PORT
1 series · 1 of 1 positions shown · non-contrast
Comparison: 08/20/2015, 08/26/2015

CLINICAL DATA: Intraoperative localization for L5-S1 decompression

EXAM:
PORTABLE SPINE - 1 VIEW

[ls-spine x-table]
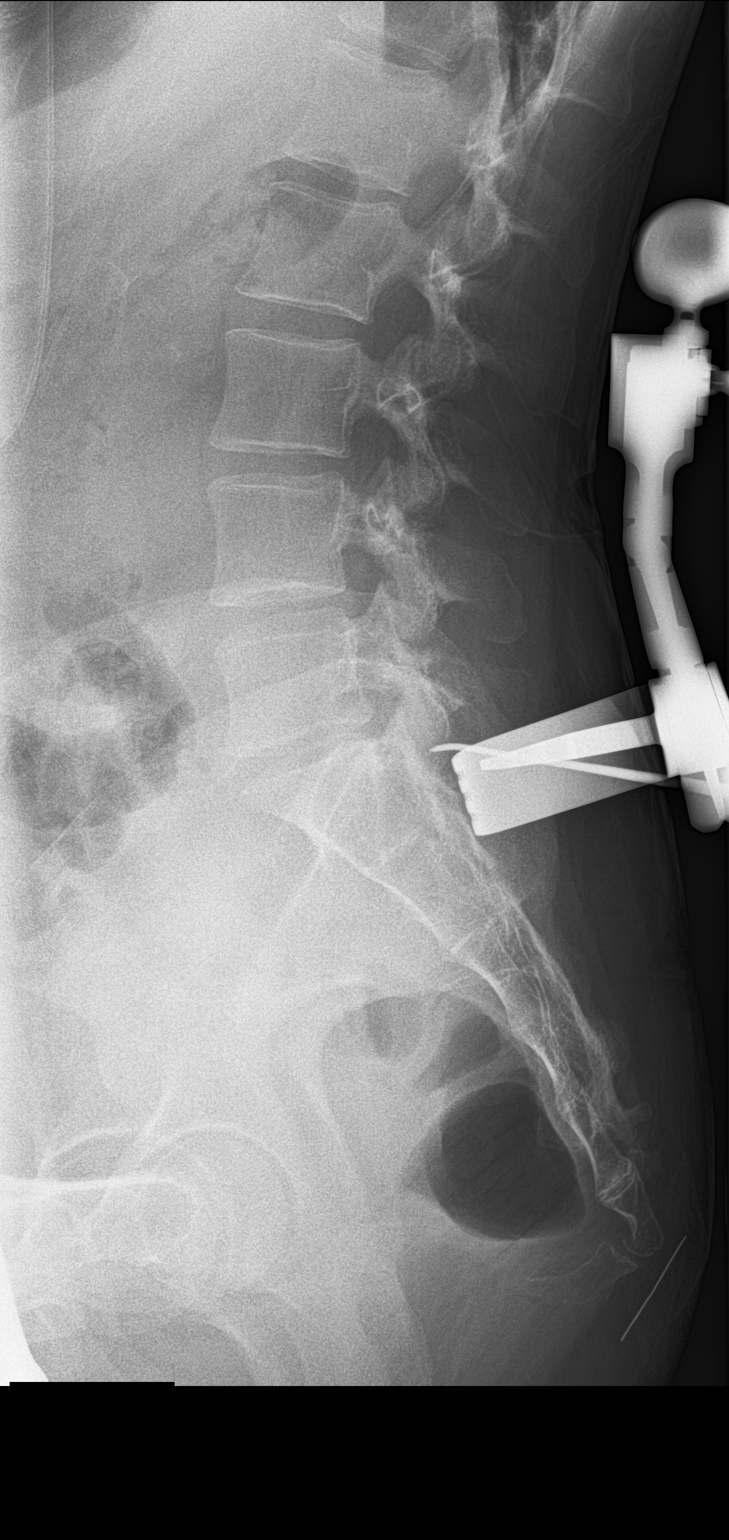

[1 of 1 positions shown; findings below may reference images not displayed]

FINDINGS: Surgical retractors now seen posterior to the S1 level with surgical
instruments at the L5-S1 posterior elements. The numbering
nomenclature is similar to that utilized on prior plain film
examination.
IMPRESSION: Intraoperative localization at L5-S1.

## 2017-04-02 IMAGING — DX DG SPINE 1V PORT
1 series · 1 of 1 positions shown · non-contrast
Comparison: Study obtained earlier in the day

CLINICAL DATA: Lumbar decompression

EXAM:
PORTABLE SPINE - 1 VIEW

[ls-spine x-table]
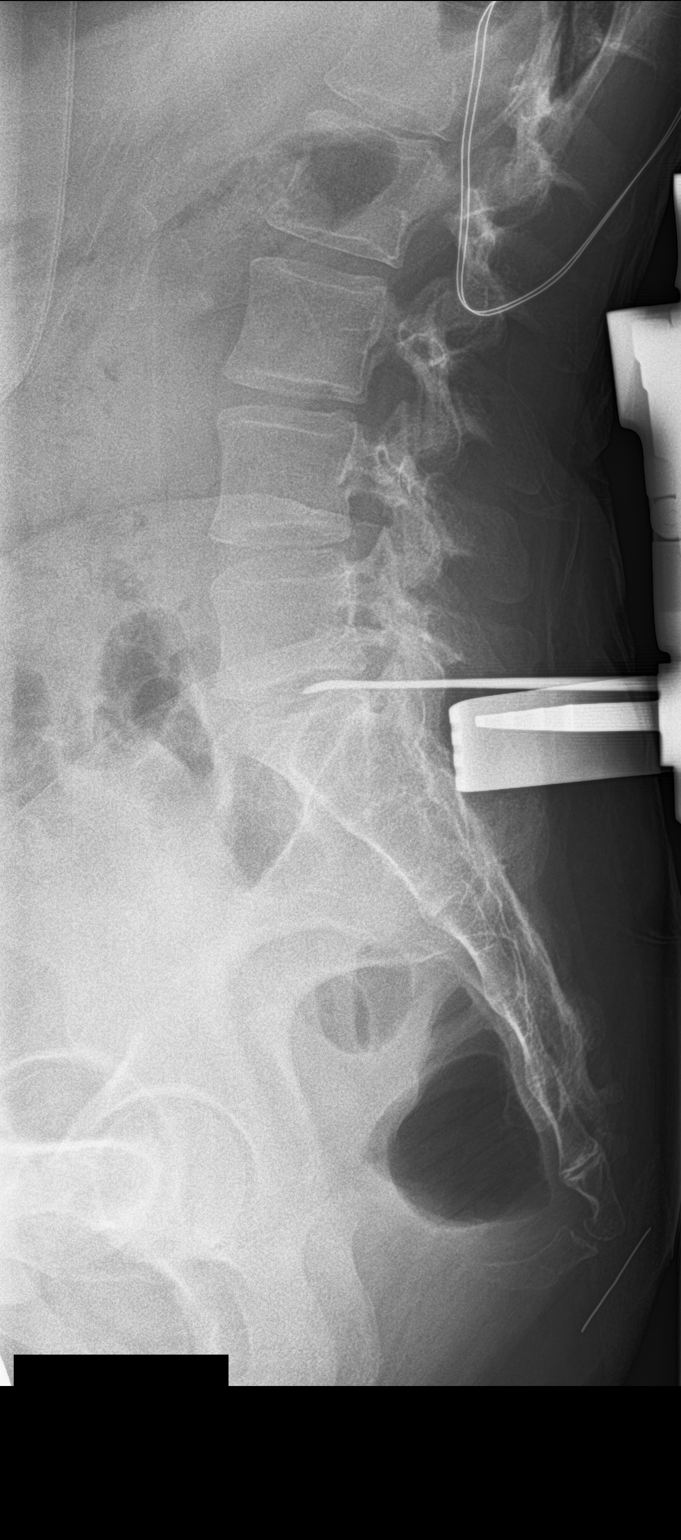

[1 of 1 positions shown; findings below may reference images not displayed]

FINDINGS: Cross-table lateral image is labeled #3. Metallic probe tip overlies
the L5-S1 interspace from a posterior approach. No fracture or
spondylolisthesis. There is moderate disc space narrowing at L4-5
and L5-S1.
IMPRESSION: Metallic probe tip overlies the L5-S1 interspace from a posterior
approach. There is disc space narrowing at L4-5 and L5-S1. No
fracture or spondylolisthesis.

## 2017-06-05 IMAGING — CR DG CHEST 2V
2 series · 2 of 2 positions shown · non-contrast
Comparison: None.

CLINICAL DATA: Chest tightness

EXAM:
CHEST  2 VIEW

[w chest pa]
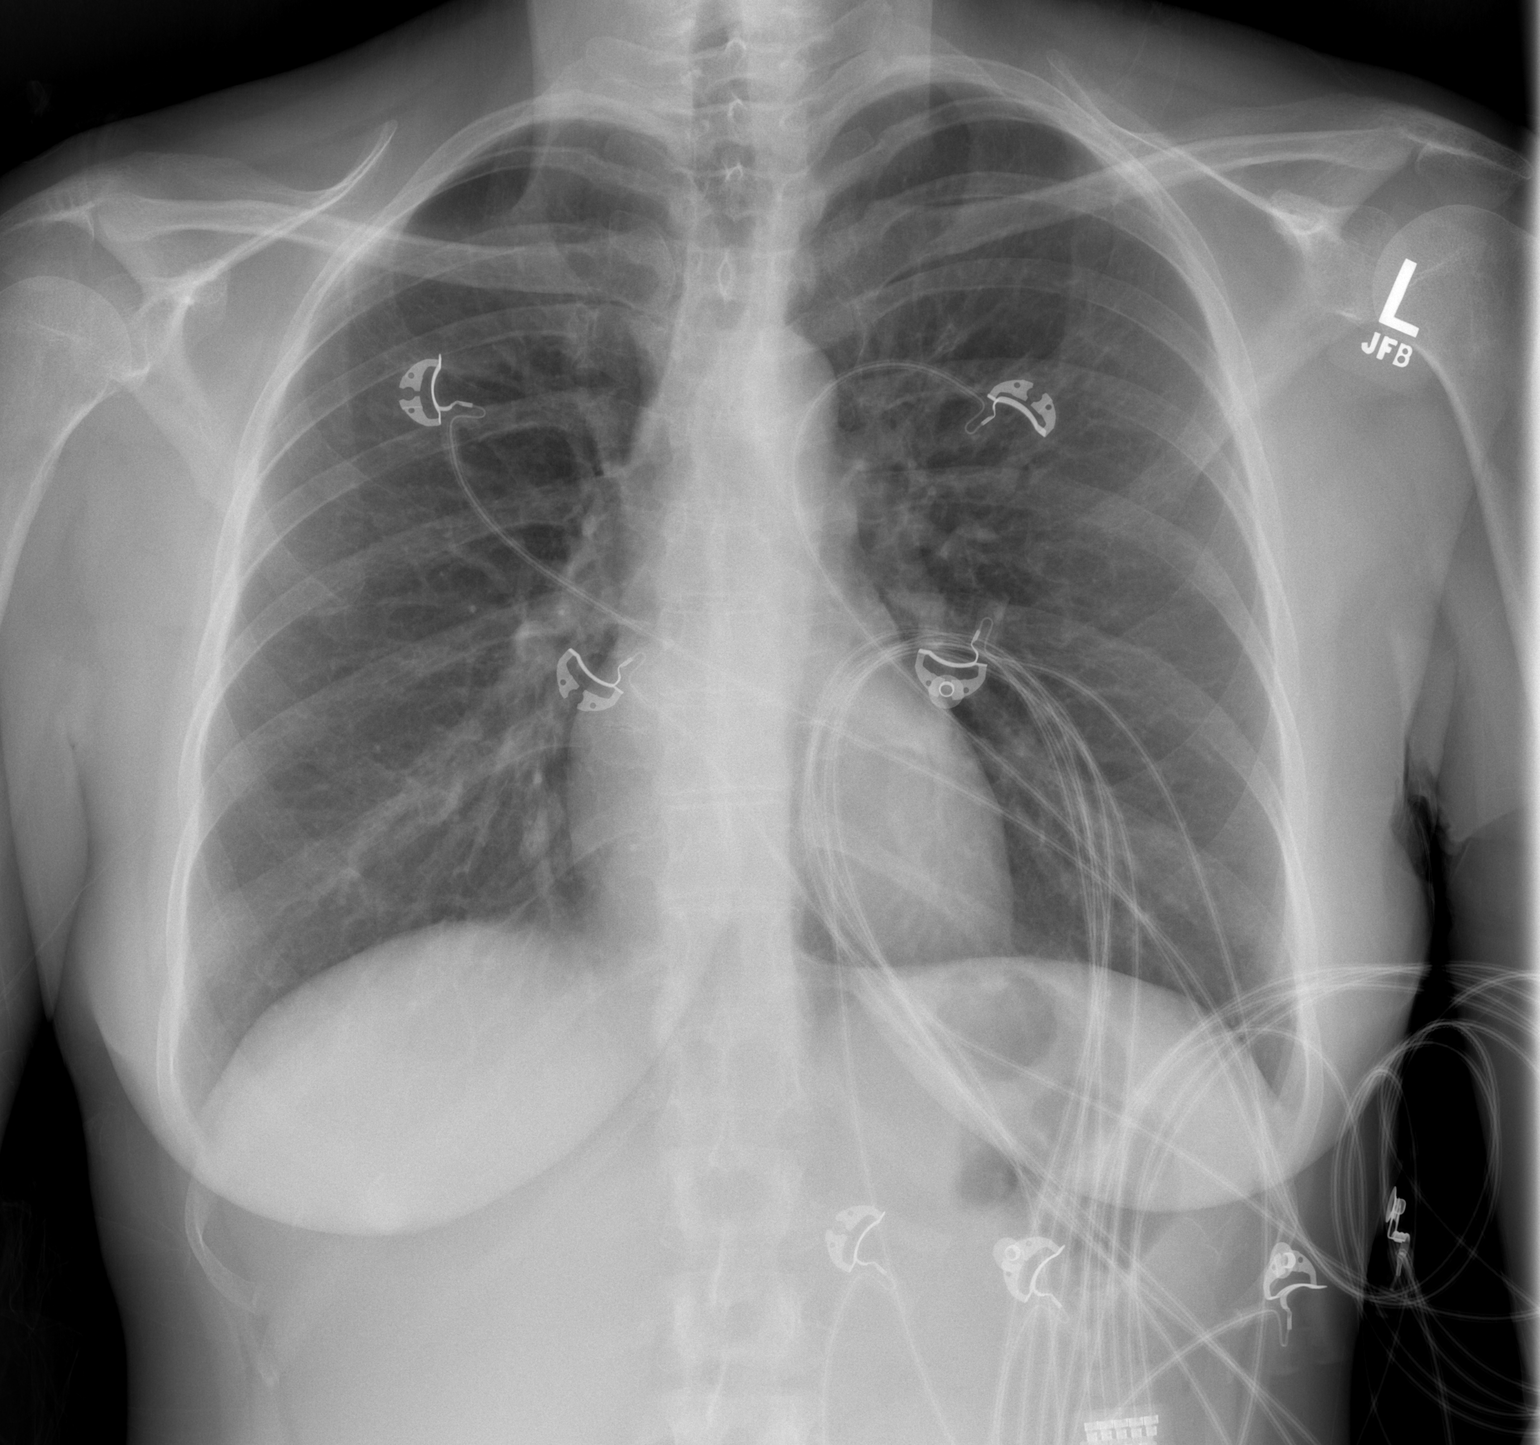

[w chest lat]
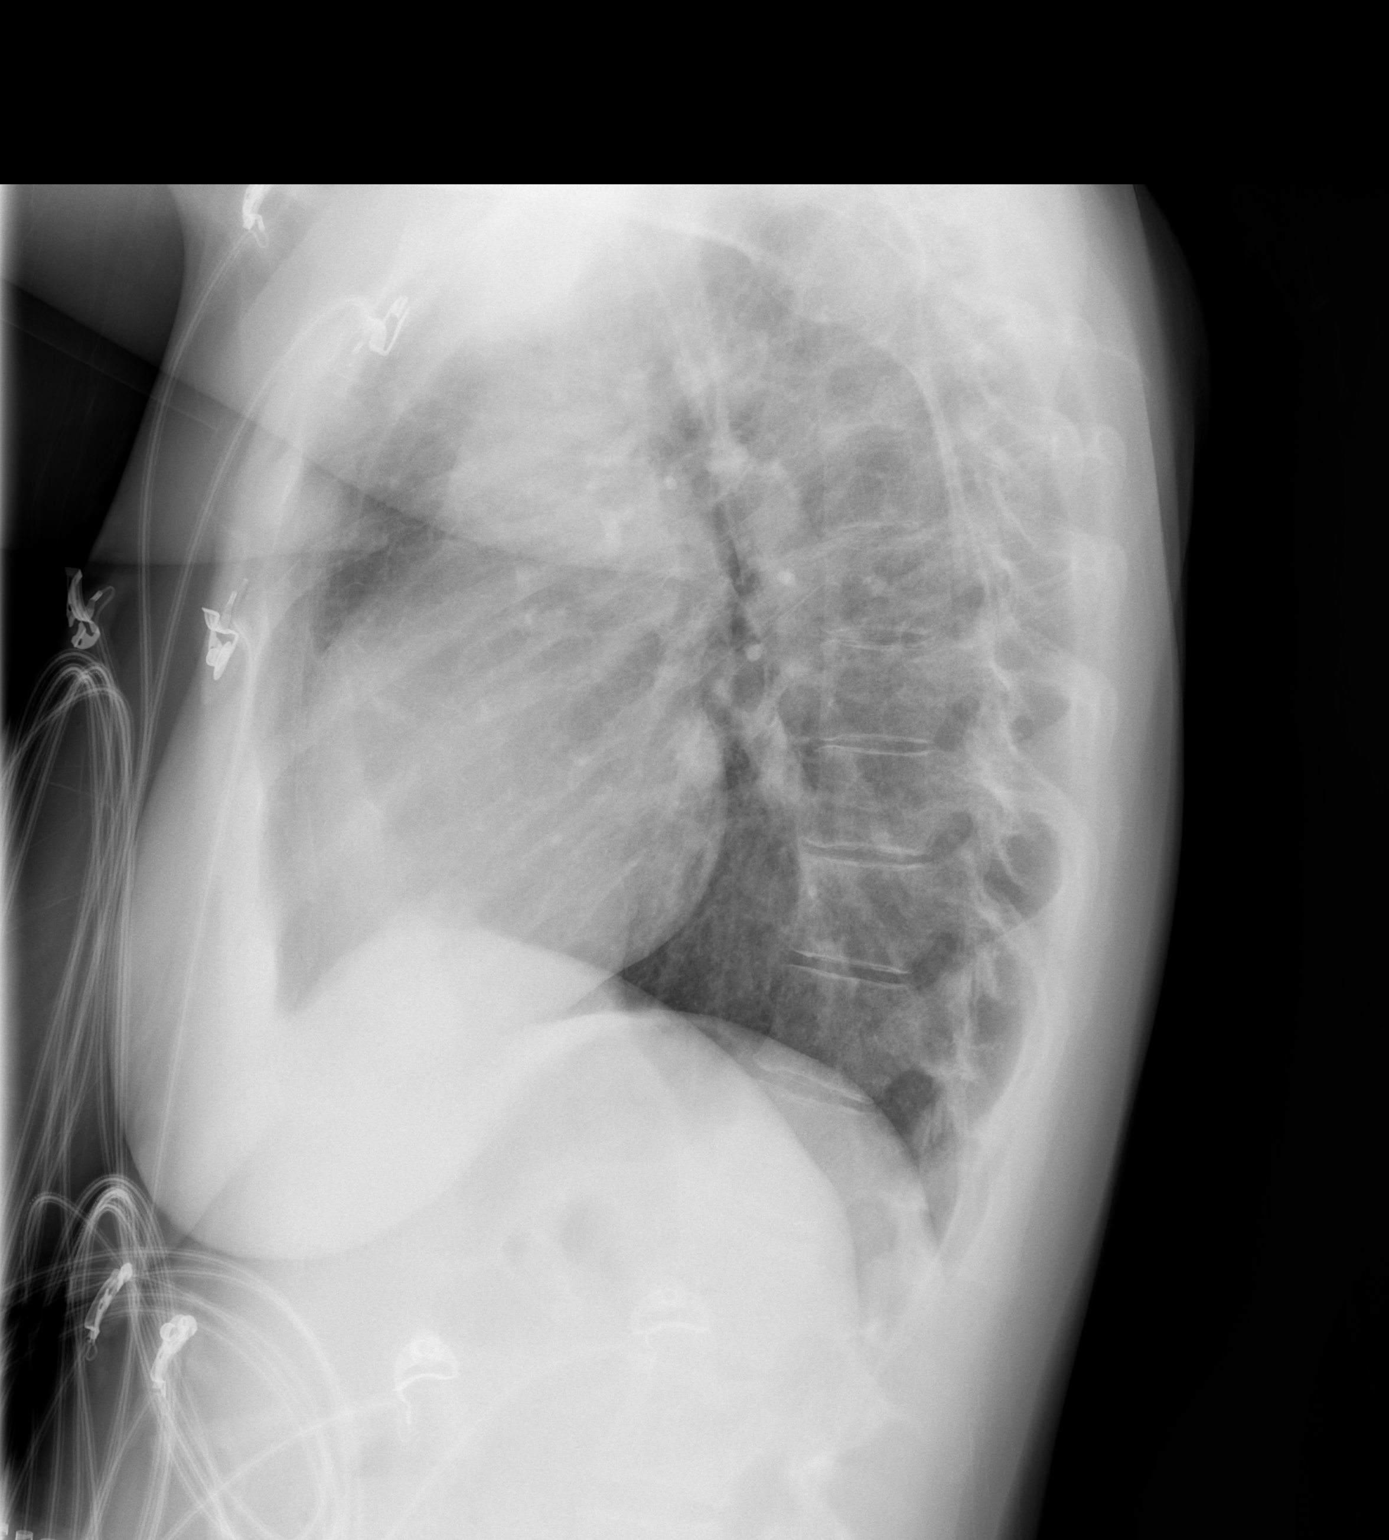

[2 of 2 positions shown; findings below may reference images not displayed]

FINDINGS: Normal heart size. Normal mediastinal contour. No pneumothorax. No
pleural effusion. Lungs appear clear, with no acute consolidative
airspace disease and no pulmonary edema.
IMPRESSION: No active cardiopulmonary disease.

## 2017-06-13 LAB — HM MAMMOGRAPHY: HM Mammogram: ABNORMAL — AB

## 2017-06-14 ENCOUNTER — Telehealth: Payer: Self-pay | Admitting: *Deleted

## 2017-06-14 NOTE — Telephone Encounter (Signed)
[facility] additional Imaging Evaluation Needed; forwarded to provider/SLS 01/03

## 2017-06-21 ENCOUNTER — Encounter: Payer: Self-pay | Admitting: Family Medicine

## 2017-08-08 ENCOUNTER — Ambulatory Visit: Payer: BLUE CROSS/BLUE SHIELD | Admitting: Family Medicine

## 2017-08-09 ENCOUNTER — Encounter: Payer: Self-pay | Admitting: Medical

## 2017-08-09 ENCOUNTER — Ambulatory Visit: Payer: BLUE CROSS/BLUE SHIELD | Admitting: Medical

## 2017-08-09 ENCOUNTER — Ambulatory Visit (INDEPENDENT_AMBULATORY_CARE_PROVIDER_SITE_OTHER): Payer: BLUE CROSS/BLUE SHIELD | Admitting: Medical

## 2017-08-09 VITALS — BP 129/84 | HR 100 | Temp 98.2°F | Resp 16 | Ht 61.0 in | Wt 133.4 lb

## 2017-08-09 DIAGNOSIS — R0781 Pleurodynia: Secondary | ICD-10-CM

## 2017-08-09 DIAGNOSIS — J40 Bronchitis, not specified as acute or chronic: Secondary | ICD-10-CM

## 2017-08-09 DIAGNOSIS — R05 Cough: Secondary | ICD-10-CM

## 2017-08-09 DIAGNOSIS — R059 Cough, unspecified: Secondary | ICD-10-CM

## 2017-08-09 MED ORDER — BENZONATATE 100 MG PO CAPS: 100.0000 mg | ORAL_CAPSULE | Freq: Three times a day (TID) | ORAL | 0 refills | Status: DC | PRN

## 2017-08-09 MED ORDER — VALACYCLOVIR HCL 1 G PO TABS: 1000.0000 mg | ORAL_TABLET | Freq: Two times a day (BID) | ORAL | 0 refills | Status: DC

## 2017-08-09 MED ORDER — AZITHROMYCIN 250 MG PO TABS
ORAL_TABLET | ORAL | 0 refills | Status: DC
Start: 2017-08-09 — End: 2018-01-22

## 2017-08-09 MED ORDER — FLUCONAZOLE 150 MG PO TABS: ORAL_TABLET | ORAL | 0 refills | Status: DC

## 2017-08-09 NOTE — Progress Notes (Signed)
Subjective:    Patient ID: Wendy Harrell, female    DOB: 11-07-1975, 42 y.o.   MRN: [id number]  HPI   Pt in for evaluation. Pt states she had some left area rib pain that occurs when she coughs, takes deep breaths and when she moves her left upper ext.   Pt last week was st and some chest congestion for 7 days. Pt states first two days of that illness had body aches and fevers.   Pt states a lot better now. But some greenish mucus mostly in the morning.  Pt states at night she gets severe fits of cough.  lmp- 08/02/2017.   Review of Systems  Constitutional: Negative for chills, fatigue and fever.  Respiratory: Positive for cough. Negative for chest tightness, shortness of breath and wheezing.   Cardiovascular: Negative for chest pain and palpitations.  Gastrointestinal: Negative for abdominal pain.  Musculoskeletal: Negative for back pain.       Left rib pain.  Neurological: Negative for dizziness and headaches.  Hematological: Negative for adenopathy. Does not bruise/bleed easily.  Psychiatric/Behavioral: Negative for behavioral problems and confusion.    Past Medical History:  Diagnosis Date  . Cervical cancer screening 08/29/2013  . Chicken pox as a child  . Dermatitis 03/01/2011  . GERD (gastroesophageal reflux disease)   . GERD (gastroesophageal reflux disease) 01/18/2016  . History of echocardiogram    a. Echo 6/17: EF 55-60%, no RWMA, normal diastolic function, trivial MR, mild TR  . 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 Hypoglycemia 06/09/2016  . Insomnia 06/09/2016  . Interstitial cystitis 03/01/2011  . Loss of weight 08/23/2014  . Raynaud's disease   . Sinus tachycardia    Since her 20's.  Resting HR of 100-110 is not unusual  //  Event monitor 6/17: NSR, no sig arrhythmias   . Slipped cervical disc   . Vitamin B 12 deficiency 10/21/2015  . Vitamin D deficiency 10/07/2015     Social History   Socioeconomic History  . Marital status: Married    Spouse name: Not on  file  . Number of children: 1  . Years of education: Not on file  . Highest education level: Not on file  Social Needs  . Financial resource strain: Not on file  . Food insecurity - worry: Not on file  . Food insecurity - inability: Not on file  . Transportation needs - medical: Not on file  . Transportation needs - non-medical: Not on file  Occupational History  . Occupation: Stay-at-home  Tobacco Use  . Smoking status: Never Smoker  . Smokeless tobacco: Never Used  Substance and Sexual Activity  . Alcohol use: No  . Drug use: No  . Sexual activity: Yes    Partners: Male    Birth control/protection: Surgical  Other Topics Concern  . Not on file  Social History Narrative  . Not on file    Past Surgical History:  Procedure Laterality Date  . cystoscopy  2001   benign  . laproscopy  2001  . LUMBAR LAMINECTOMY/DECOMPRESSION MICRODISCECTOMY Left 08/26/2015   Procedure: MICRO LUMBAR DECOMPRESSION  L5 - S1 ON LEFT;  Surgeon: Jene Every, MD;  Location: [facility];  Service: Orthopedics;  Laterality: Left;    Family History  Problem Relation Age of Onset  . Depression Father   . Heart disease Father   . Hypertension Father   . Hyperlipidemia Father   . Diabetes Father        type 2  . Arthritis  Father   . Anxiety disorder Father   . Colon polyps Father   . Alcohol abuse Maternal Grandfather   . Depression Paternal Grandmother   . Alcohol abuse Paternal Grandmother   . Alcohol abuse Paternal Grandfather   . Heart attack Paternal Grandfather   . Cancer Maternal Grandmother        skin cancer, breast cancer  . Gallbladder disease Maternal Grandmother   . Alzheimer's disease Maternal Aunt   . Hyperlipidemia Mother   . Hyperparathyroidism Mother   . Gallbladder disease Maternal Aunt        x4  . Gallbladder disease Maternal Uncle   . Colon cancer Neg Hx   . Esophageal cancer Neg Hx     Allergies  Allergen Reactions  . Prozac [Fluoxetine Hcl]   . Robaxin  [Methocarbamol] Other (See Comments)    syncope  . Adhesive [Tape] Rash    Band-Aids gives pt a rash  . Sulfa Antibiotics Rash    Blisters in the mouth    No current outpatient medications on file prior to visit.   No current facility-administered medications on file prior to visit.     BP 129/84   Pulse 100   Temp 98.2 F (36.8 C) (Oral)   Resp 16   Ht 5\' 1"  (1.549 m)   Wt 133 lb 6.4 oz (60.5 kg)   SpO2 100%   BMI 25.21 kg/m       Objective:   Physical Exam  General  Mental Status - Alert. General Appearance - Well groomed. Not in acute distress.  Skin Rashes- No Rashes.  HEENT Head- Normal. Ear Auditory Canal - Left- Normal. Right - Normal.Tympanic Membrane- Left- Normal. Right- Normal. Eye Sclera/Conjunctiva- Left- Normal. Right- Normal. Nose & Sinuses Nasal Mucosa- Left-  Boggy and Congested. Right-  Boggy and  Congested.Bilateral no maxillary and no frontal sinus pressure. Mouth & Throat Lips: Upper Lip- Normal: no dryness, cracking, pallor, cyanosis, or vesicular eruption. Lower Lip-Normal: no dryness, cracking, pallor, cyanosis or vesicular eruption. Buccal Mucosa- Bilateral- No Aphthous ulcers. Oropharynx- No Discharge or Erythema. Tonsils: Characteristics- Bilateral- No Erythema or Congestion. Size/Enlargement- Bilateral- No enlargement. Discharge- bilateral-None.  Neck Neck- Supple. No Masses.   Chest and Lung Exam Auscultation: Breath Sounds:-Clear even and unlabored.  Cardiovascular Auscultation:Rythm- Regular, rate and rhythm. Murmurs & Other Heart Sounds:Ausculatation of the heart reveal- No Murmurs.  Lymphatic Head & Neck General Head & Neck Lymphatics: Bilateral: Description- No Localized lymphadenopathy.  Anterior thorax- and patient has some mild tenderness palpation of left lower rib/underneath her breast.  On inspection of the skin in this area and her axillary region there is no rash or vesicular eruption.       Assessment &  Plan:  Currently you appear to have some probable residual bronchitis with anterior thorax/rib region muscle strain from excess coughing.  I do think since your cough is described as productive that a azithromycin antibiotic would be reasonable.  Also I think is a good idea to use benzonatate cough tabs to reduce the cough and expansion of rib cage.  If your productive cough or rib pain persist after 7 days then would recommend you get chest x-ray.  Future chest x-ray is being placed today.(pt expressed concern of cost)  Also refilled your Valtrex for your history of occasional viral eruption on chest.  And also sent in prescription of Diflucan for your occasional yeast infections.  Follow-up in 7-10 days or as needed.  Esperanza RichtersEdward Deric Bocock, PA-C

## 2017-08-09 NOTE — Patient Instructions (Addendum)
Currently you appear to have some probable residual bronchitis with anterior thorax/rib region muscle strain from excess coughing.  I do think since your cough is described as productive  that azithromycin antibiotic would be reasonable/good idea.  Also I think is a good idea to use benzonatate cough tabs to reduce the cough and expansion of rib cage.  If your productive cough or rib pain persist after 7 days then would recommend you get chest x-ray.  Future chest x-ray is being placed today.  Also refilled your Valtrex for your history of occasional viral eruption on chest.  And also sent in prescription of Diflucan for your occasional yeast infections.  Follow-up in 7-10 days or as needed.

## 2017-08-10 ENCOUNTER — Encounter: Payer: Self-pay | Admitting: Medical

## 2017-08-10 ENCOUNTER — Ambulatory Visit: Payer: Self-pay | Admitting: *Deleted

## 2017-08-10 NOTE — Telephone Encounter (Signed)
Patient was seen in the office and given several prescriptions. She took the antibiotic, cough medication and Diflucan last night. This morning in the shower she noticed a rash on her abdomen. This is the only area the rash is located on and she is not reporting any other symptoms with it. She has not taken anything today and she is going to try to send a picture into the provider through her MyChart app. She would like to know if she needs to change her antibiotic. Advised patient to have Benadryl on hand for any itching she may develop. Reason for Disposition . Mild localized rash  Answer Assessment - Initial Assessment Questions 1. APPEARANCE of RASH: "Describe the rash."      Red dots 2. LOCATION: "Where is the rash located?"      abdomen 3. NUMBER: "How many spots are there?"      numerous 4. SIZE: "How big are the spots?" (Inches, centimeters or compare to size of a coin)      Tip of marker 5. ONSET: "When did the rash start?"      This morning 6. ITCHING: "Does the rash itch?" If so, ask: "How bad is the itch?"  (Scale 1-10; or mild, moderate, severe)     no 7. PAIN: "Does the rash hurt?" If so, ask: "How bad is the pain?"  (Scale 1-10; or mild, moderate, severe)     no 8. OTHER SYMPTOMS: "Do you have any other symptoms?" (e.g., fever)     no 9. PREGNANCY: "Is there any chance you are pregnant?" "When was your last menstrual period?"     No- vasectomy  Answer Assessment - Initial Assessment Questions 1. APPEARANCE of RASH: "Describe the rash." (e.g., spots, blisters, raised areas, skin peeling, scaly)     spots 2. SIZE: "How big are the spots?" (e.g., tip of pen, eraser, coin; inches, centimeters)     Tup of marker 3. LOCATION: "Where is the rash located?"     abdomen 4. COLOR: "What color is the rash?" (Note: It is difficult to assess rash color in people with darker-colored skin. When this situation occurs, simply ask the caller to describe what they see.)     red 5. ONSET:  "When did the rash begin?"     Noticed this morning in shower 6. FEVER: "Do you have a fever?" If so, ask: "What is your temperature, how was it measured, and when did it start?"     no 7. ITCHING: "Does the rash itch?" If so, ask: "How bad is the itch?" (Scale 1-10; or mild, moderate, severe)     No itching 8. CAUSE: "What do you think is causing the rash?"     Possible reaction to medication 9. NEW MEDICATION: "What new medication are you taking?" (e.g., name of antibiotic) "When did you start taking this medication?".     Azithromycin, cough medication, Diflucan 10. OTHER SYMPTOMS: "Do you have any other symptoms?" (e.g., sore throat, fever, joint pain)       no 11. PREGNANCY: "Is there any chance you are pregnant?" "When was your last menstrual period?"       no  Protocols used: RASH OR REDNESS - LOCALIZED-A-AH, RASH - WIDESPREAD ON DRUGS-A-AH

## 2017-10-22 ENCOUNTER — Encounter: Payer: Self-pay | Admitting: Family Medicine

## 2017-10-23 ENCOUNTER — Ambulatory Visit: Payer: Self-pay | Admitting: Family

## 2017-10-23 ENCOUNTER — Encounter: Payer: Self-pay | Admitting: Family

## 2017-10-23 ENCOUNTER — Other Ambulatory Visit: Payer: Self-pay | Admitting: Family Medicine

## 2017-10-23 VITALS — BP 102/72 | HR 80 | Temp 98.5°F | Wt 126.4 lb

## 2017-10-23 DIAGNOSIS — S30860A Insect bite (nonvenomous) of lower back and pelvis, initial encounter: Secondary | ICD-10-CM

## 2017-10-23 DIAGNOSIS — W57XXXA Bitten or stung by nonvenomous insect and other nonvenomous arthropods, initial encounter: Secondary | ICD-10-CM

## 2017-10-23 MED ORDER — DOXYCYCLINE HYCLATE 100 MG PO TABS: 100.0000 mg | ORAL_TABLET | Freq: Two times a day (BID) | ORAL | 0 refills | Status: DC

## 2017-10-23 NOTE — Patient Instructions (Signed)

## 2017-10-23 NOTE — Progress Notes (Signed)
Subjective:    Patient ID: Wendy Harrell, female    DOB: Feb 17, 1976, 42 y.o.   MRN: [id number]  HPI 42 year old female is in today with c/o a tick bite to the right shoulder that she found on Sunday. She believes the tick may have been present 3 days. She now has redness and pain at the site. It redness is spreading and she is growing concerned.    Review of Systems  Constitutional: Negative.   Respiratory: Negative.   Cardiovascular: Negative.   Skin: Positive for wound.       Right upper shoulder   Psychiatric/Behavioral: Negative.    Past Medical History:  Diagnosis Date  . Cervical cancer screening 08/29/2013  . Chicken pox as a child  . Dermatitis 03/01/2011  . GERD (gastroesophageal reflux disease)   . GERD (gastroesophageal reflux disease) 01/18/2016  . History of echocardiogram    a. Echo 6/17: EF 55-60%, no RWMA, normal diastolic function, trivial MR, mild TR  . 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 Hypoglycemia 06/09/2016  . Insomnia 06/09/2016  . Interstitial cystitis 03/01/2011  . Loss of weight 08/23/2014  . Raynaud's disease   . Sinus tachycardia    Since her 20's.  Resting HR of 100-110 is not unusual  //  Event monitor 6/17: NSR, no sig arrhythmias   . Slipped cervical disc   . Vitamin B 12 deficiency 10/21/2015  . Vitamin D deficiency 10/07/2015    Social History   Socioeconomic History  . Marital status: Married    Spouse name: Not on file  . Number of children: 1  . Years of education: Not on file  . Highest education level: Not on file  Occupational History  . 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 Financial resource strain: Not on file  . Food insecurity:    Worry: Not on file    Inability: Not on file  . Transportation needs:    Medical: Not on file    Non-medical: Not on file  Tobacco Use  . Smoking status: Never Smoker  . Smokeless tobacco: Never Used  Substance and Sexual Activity  . Alcohol use: No  . Drug use: No  . Sexual activity: Yes      Partners: Male    Birth control/protection: Surgical  Lifestyle  . Physical activity:    Days per week: Not on file    Minutes per session: Not on file  . Stress: Not on file  Relationships  . Social connections:    Talks on phone: Not on file    Gets together: Not on file    Attends religious service: Not on file    Active member of club or organization: Not on file    Attends meetings of clubs or organizations: Not on file    Relationship status: Not on file  . Intimate partner violence:    Fear of current or ex partner: Not on file    Emotionally abused: Not on file    Physically abused: Not on file    Forced sexual activity: Not on file  Other Topics Concern  . Not on file  Social History Narrative  . Not on file    Past Surgical History:  Procedure Laterality Date  . cystoscopy  2001   benign  . laproscopy  2001  . LUMBAR LAMINECTOMY/DECOMPRESSION MICRODISCECTOMY Left 08/26/2015   Procedure: MICRO LUMBAR DECOMPRESSION  L5 - S1 ON LEFT;  Surgeon: Jene Every, MD;  Location: [facility];  Service:  Orthopedics;  Laterality: Left;    Family History  Problem Relation Age of Onset  . Depression Father   . Heart disease Father   . Hypertension Father   . Hyperlipidemia Father   . Diabetes Father        type 2  . Arthritis Father   . Anxiety disorder Father   . Colon polyps Father   . Alcohol abuse Maternal Grandfather   . Depression Paternal Grandmother   . Alcohol abuse Paternal Grandmother   . Alcohol abuse Paternal Grandfather   . Heart attack Paternal Grandfather   . Cancer Maternal Grandmother        skin cancer, breast cancer  . Gallbladder disease Maternal Grandmother   . Alzheimer's disease Maternal Aunt   . Hyperlipidemia Mother   . Hyperparathyroidism Mother   . Gallbladder disease Maternal Aunt        x4  . Gallbladder disease Maternal Uncle   . Colon cancer Neg Hx   . Esophageal cancer Neg Hx     Allergies  Allergen Reactions  . Prozac  [Fluoxetine Hcl]   . Robaxin [Methocarbamol] Other (See Comments)    syncope  . Adhesive [Tape] Rash    Band-Aids gives pt a rash  . Sulfa Antibiotics Rash    Blisters in the mouth    Current Outpatient Medications on File Prior to Visit  Medication Sig Dispense Refill  . azithromycin (ZITHROMAX) 250 MG tablet Take 2 tablets by mouth on day 1, followed by 1 tablet by mouth daily for 4 days. 6 tablet 0  . benzonatate (TESSALON) 100 MG capsule Take 1 capsule (100 mg total) by mouth 3 (three) times daily as needed for cough. 21 capsule 0  . fluconazole (DIFLUCAN) 150 MG tablet 1 tab po q day 3 tablet 0  . valACYclovir (VALTREX) 1000 MG tablet Take 1 tablet (1,000 mg total) by mouth 2 (two) times daily. 20 tablet 0   No current facility-administered medications on file prior to visit.     BP 102/72   Pulse 80   Temp 98.5 F (36.9 C)   Wt 126 lb 6.4 oz (57.3 kg)   BMI 23.88 kg/m chart    Objective:   Physical Exam  Constitutional: She appears well-developed and well-nourished.  Cardiovascular: Normal rate and regular rhythm.  Pulmonary/Chest: Effort normal and breath sounds normal.  Skin:             Assessment & Plan:  Lemma was seen today for tick bite.  Diagnoses and all orders for this visit:  Tick bite of back, initial encounter  Other orders -     doxycycline (VIBRA-TABS) 100 MG tablet; Take 1 tablet (100 mg total) by mouth 2 (two) times daily.  Call the office with any questions or concerns.

## 2018-01-04 ENCOUNTER — Encounter: Payer: BLUE CROSS/BLUE SHIELD | Admitting: Family Medicine

## 2018-01-11 ENCOUNTER — Encounter: Payer: Self-pay | Admitting: Family Medicine

## 2018-01-16 ENCOUNTER — Other Ambulatory Visit: Payer: Self-pay | Admitting: Family Medicine

## 2018-01-16 MED ORDER — AMITRIPTYLINE HCL 25 MG PO TABS: 25.0000 mg | ORAL_TABLET | Freq: Every day | ORAL | 0 refills | Status: DC

## 2018-01-21 ENCOUNTER — Ambulatory Visit: Payer: BLUE CROSS/BLUE SHIELD | Admitting: Medical

## 2018-01-21 ENCOUNTER — Telehealth: Payer: Self-pay | Admitting: *Deleted

## 2018-01-21 NOTE — Telephone Encounter (Signed)
Will see Wendy Harrell Wendy Harrell on 01/22/18

## 2018-01-21 NOTE — Telephone Encounter (Signed)
[facility]; forwarded to provider/SLS 08/12

## 2018-01-22 ENCOUNTER — Encounter: Payer: Self-pay | Admitting: Medical

## 2018-01-22 ENCOUNTER — Ambulatory Visit (INDEPENDENT_AMBULATORY_CARE_PROVIDER_SITE_OTHER): Payer: BLUE CROSS/BLUE SHIELD | Admitting: Medical

## 2018-01-22 VITALS — BP 110/77 | HR 111 | Temp 98.3°F | Resp 16 | Ht 61.0 in | Wt 123.2 lb

## 2018-01-22 DIAGNOSIS — K219 Gastro-esophageal reflux disease without esophagitis: Secondary | ICD-10-CM | POA: Diagnosis not present

## 2018-01-22 DIAGNOSIS — G4700 Insomnia, unspecified: Secondary | ICD-10-CM

## 2018-01-22 DIAGNOSIS — F329 Major depressive disorder, single episode, unspecified: Secondary | ICD-10-CM

## 2018-01-22 DIAGNOSIS — R350 Frequency of micturition: Secondary | ICD-10-CM | POA: Diagnosis not present

## 2018-01-22 DIAGNOSIS — F32A Depression, unspecified: Secondary | ICD-10-CM

## 2018-01-22 DIAGNOSIS — F419 Anxiety disorder, unspecified: Secondary | ICD-10-CM

## 2018-01-22 LAB — COMPREHENSIVE METABOLIC PANEL
ALBUMIN: 4.8 g/dL (ref 3.5–5.2)
ALT: 14 U/L (ref 0–35)
AST: 13 U/L (ref 0–37)
Alkaline Phosphatase: 71 U/L (ref 39–117)
BILIRUBIN TOTAL: 0.6 mg/dL (ref 0.2–1.2)
BUN: 6 mg/dL (ref 6–23)
CALCIUM: 9.9 mg/dL (ref 8.4–10.5)
CO2: 32 meq/L (ref 19–32)
Chloride: 102 mEq/L (ref 96–112)
Creatinine, Ser: 0.69 mg/dL (ref 0.40–1.20)
GFR: 99.22 mL/min (ref >60.00)
Glucose, Bld: 92 mg/dL (ref 70–99)
Potassium: 4 mEq/L (ref 3.5–5.1)
Sodium: 139 mEq/L (ref 135–145)
Total Protein: 7.8 g/dL (ref 6.0–8.3)

## 2018-01-22 LAB — URINALYSIS, ROUTINE W REFLEX MICROSCOPIC
BILIRUBIN URINE: NEGATIVE
KETONES UR: NEGATIVE
Leukocytes, UA: NEGATIVE
Nitrite: NEGATIVE
PH: 8.5 — AB (ref 5.0–8.0)
Specific Gravity, Urine: 1.01 (ref 1.000–1.030)
Total Protein, Urine: NEGATIVE
URINE GLUCOSE: NEGATIVE
UROBILINOGEN UA: 0.2 (ref 0.0–1.0)

## 2018-01-22 MED ORDER — HYDROXYZINE HCL 10 MG PO TABS: ORAL_TABLET | ORAL | 1 refills | Status: DC

## 2018-01-22 NOTE — Patient Instructions (Addendum)
For your anxiety, depression and insomnia, I want you to stay on wellbutrin and use ativan as needed for severe anxiety. You can also use low dose hydroxyzine for anxiety or insomnia(. Can use 2 tab if needed) But cautioned not to use both ativan and hydroxyzine together.  For gerd type symptoms can use zantac otc.  For frequent urination will get cmp and ua/culture.  Update me in one week how you are or sooner.  Follow up with Dr. Abner GreenspanBlyth end of this month or as needed

## 2018-01-22 NOTE — Progress Notes (Signed)
Subjective:    Patient ID: Wendy Harrell, female    DOB: [birth year], 42 y.o.   MRN: [id number]  HPI  Pt in for some severe anxiety. Pt daughter has some severe autism. Pt is sad and describes that she is extrovert but she has to be at home with daughter a lot due to daughter medical conditions. So she describes high level stress since her daughter has severe gerd and autism. Pt is basically full time care giver. Also has stress over high deductable.   Her daughter constipation and gerd causes her constant stress.  Pt had panic attack last Thursday. Pt states in past paxil and prozac did not work. Lexapro helped but she was oversedated.   Pt was given wellbutrin last week from UC. Also pt was given ativan. She is only taking one tab at night. But last night she tried 2 tab at night due to extreme anxiety. But during day she states has extreme sedation.   Pt in past used elavil in the past.(pt states elavil and wellbutrin interactions.)  Last time used wellutrin was Sunday morning.  One week ago had sinus tachycardia in ED.     Review of Systems  HENT: Negative for dental problem.   Respiratory: Negative for cough, chest tightness, shortness of breath and wheezing.   Cardiovascular: Negative for chest pain and palpitations.  Gastrointestinal: Negative for abdominal distention, abdominal pain, anal bleeding, diarrhea and nausea.       Recent pain eating. Describes reflux.  She took famotidine just recently and this did help some. Pt not sure how well it help.     Genitourinary: Positive for frequency.       Hx of instertitial cystitis  Neurological: Negative for dizziness, tremors, weakness and headaches.  Hematological: Negative for adenopathy. Does not bruise/bleed easily.  Psychiatric/Behavioral: Positive for dysphoric mood and sleep disturbance. Negative for agitation, behavioral problems and suicidal ideas. The patient is nervous/anxious.    Past Medical History:    Diagnosis Date  . Cervical cancer screening 08/29/2013  . Chicken pox as a child  . Dermatitis 03/01/2011  . GERD (gastroesophageal reflux disease)   . GERD (gastroesophageal reflux disease) 01/18/2016  . History of echocardiogram    a. Echo 6/17: EF 55-60%, no RWMA, normal diastolic function, trivial MR, mild TR  . 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 Hypoglycemia 06/09/2016  . Insomnia 06/09/2016  . Interstitial cystitis 03/01/2011  . Loss of weight 08/23/2014  . Raynaud's disease   . Sinus tachycardia    Since her 20's.  Resting HR of 100-110 is not unusual  //  Event monitor 6/17: NSR, no sig arrhythmias   . Slipped cervical disc   . Vitamin B 12 deficiency 10/21/2015  . Vitamin D deficiency 10/07/2015     Social History   Socioeconomic History  . Marital status: Married    Spouse name: Not on file  . Number of children: 1  . Years of education: Not on file  . Highest education level: Not on file  Occupational History  . 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 Financial resource strain: Not on file  . Food insecurity:    Worry: Not on file    Inability: Not on file  . Transportation needs:    Medical: Not on file    Non-medical: Not on file  Tobacco Use  . Smoking status: Never Smoker  . Smokeless tobacco: Never Used  Substance and Sexual Activity  . Alcohol use: No  . Drug  use: No  . Sexual activity: Yes    Partners: Male    Birth control/protection: Surgical  Lifestyle  . Physical activity:    Days per week: Not on file    Minutes per session: Not on file  . Stress: Not on file  Relationships  . Social connections:    Talks on phone: Not on file    Gets together: Not on file    Attends religious service: Not on file    Active member of club or organization: Not on file    Attends meetings of clubs or organizations: Not on file    Relationship status: Not on file  . Intimate partner violence:    Fear of current or ex partner: Not on file    Emotionally abused:  Not on file    Physically abused: Not on file    Forced sexual activity: Not on file  Other Topics Concern  . Not on file  Social History Narrative  . Not on file    Past Surgical History:  Procedure Laterality Date  . cystoscopy  2001   benign  . laproscopy  2001  . LUMBAR LAMINECTOMY/DECOMPRESSION MICRODISCECTOMY Left 08/26/2015   Procedure: MICRO LUMBAR DECOMPRESSION  L5 - S1 ON LEFT;  Surgeon: Jene Every, MD;  Location: [facility];  Service: Orthopedics;  Laterality: Left;    Family History  Problem Relation Age of Onset  . Depression Father   . Heart disease Father   . Hypertension Father   . Hyperlipidemia Father   . Diabetes Father        type 2  . Arthritis Father   . Anxiety disorder Father   . Colon polyps Father   . Alcohol abuse Maternal Grandfather   . Depression Paternal Grandmother   . Alcohol abuse Paternal Grandmother   . Alcohol abuse Paternal Grandfather   . Heart attack Paternal Grandfather   . Cancer Maternal Grandmother        skin cancer, breast cancer  . Gallbladder disease Maternal Grandmother   . Alzheimer's disease Maternal Aunt   . Hyperlipidemia Mother   . Hyperparathyroidism Mother   . Gallbladder disease Maternal Aunt        x4  . Gallbladder disease Maternal Uncle   . Colon cancer Neg Hx   . Esophageal cancer Neg Hx     Allergies  Allergen Reactions  . Prozac [Fluoxetine Hcl]   . Robaxin [Methocarbamol] Other (See Comments)    syncope  . Adhesive [Tape] Rash    Band-Aids gives pt a rash  . Sulfa Antibiotics Rash    Blisters in the mouth    Current Outpatient Medications on File Prior to Visit  Medication Sig Dispense Refill  . buPROPion (WELLBUTRIN) 75 MG tablet Take 75 mg by mouth 2 (two) times daily.  0  . LORazepam (ATIVAN) 0.5 MG tablet Take 0.5 mg by mouth 3 (three) times daily.  0   No current facility-administered medications on file prior to visit.     BP 110/77   Pulse (!) 111   Temp 98.3 F (36.8 C) (Oral)    Resp 16   Ht 5\' 1"  (1.549 m)   Wt 123 lb 3.2 oz (55.9 kg)   SpO2 100%   BMI 23.28 kg/m       Objective:   Physical Exam  General Mental Status- Alert. General Appearance- Not in acute distress biut cries when she explains her emotions.  Skin General: Color- Normal Color. Moisture- Normal Moisture.  Neck Carotid Arteries- Normal color. Moisture- Normal Moisture. No carotid bruits. No JVD.  Chest and Lung Exam Auscultation: Breath Sounds:-Normal.  Cardiovascular Auscultation:Rythm- Regular. Murmurs & Other Heart Sounds:Auscultation of the heart reveals- No Murmurs.  Abdomen Inspection:-Inspeection Normal. Palpation/Percussion:Note:No mass. Palpation and Percussion of the abdomen reveal- Non Tender, Non Distended + BS, no rebound or guarding.   Neurologic Cranial Nerve exam:- CN III-XII intact(No nystagmus), symmetric smile. Strength:- 5/5 equal and symmetric strength both upper and lower extremities.      Assessment & Plan:  For your anxiety, depression and insomnia, I want you to stay on wellbutrin and use ativan as needed for severe anxiety. You can also use low dose hydroxyzine for anxiety or insomnia(. Can use 2 tab if needed) But cautioned not to use both ativan and hydroxyzine together.  For gerd type symptoms can use zantac otc.  For frequent urination will get cmp and ua/culture.  Update me in one week how you are or sooner.  Follow up with Dr. Abner GreenspanBlyth end of this month  or as needed  Esperanza RichtersEdward Keerat Denicola, PA-C

## 2018-01-23 ENCOUNTER — Encounter: Payer: Self-pay | Admitting: Family Medicine

## 2018-01-23 LAB — URINE CULTURE
MICRO NUMBER:: 90958986
SPECIMEN QUALITY: ADEQUATE

## 2018-01-24 ENCOUNTER — Other Ambulatory Visit: Payer: Self-pay | Admitting: Family Medicine

## 2018-01-24 MED ORDER — LORAZEPAM 1 MG PO TABS: 1.0000 mg | ORAL_TABLET | Freq: Three times a day (TID) | ORAL | 1 refills | Status: DC | PRN

## 2018-01-24 MED ORDER — CARVEDILOL 3.125 MG PO TABS: 3.1250 mg | ORAL_TABLET | Freq: Two times a day (BID) | ORAL | 3 refills | Status: DC

## 2018-02-04 ENCOUNTER — Encounter: Payer: Self-pay | Admitting: Family Medicine

## 2018-02-04 ENCOUNTER — Other Ambulatory Visit (HOSPITAL_COMMUNITY)
Admission: RE | Admit: 2018-02-04 | Discharge: 2018-02-04 | Disposition: A | Discharge status: Home / Self Care | Payer: BLUE CROSS/BLUE SHIELD | Source: Ambulatory Visit | Attending: Family Medicine | Admitting: Family Medicine

## 2018-02-04 ENCOUNTER — Ambulatory Visit (INDEPENDENT_AMBULATORY_CARE_PROVIDER_SITE_OTHER): Payer: BLUE CROSS/BLUE SHIELD | Admitting: Family Medicine

## 2018-02-04 VITALS — BP 98/72 | HR 90 | Temp 98.3°F | Resp 18 | Ht 61.0 in | Wt 121.6 lb

## 2018-02-04 DIAGNOSIS — Z124 Encounter for screening for malignant neoplasm of cervix: Secondary | ICD-10-CM

## 2018-02-04 DIAGNOSIS — K219 Gastro-esophageal reflux disease without esophagitis: Secondary | ICD-10-CM

## 2018-02-04 DIAGNOSIS — Z0000 Encounter for general adult medical examination without abnormal findings: Secondary | ICD-10-CM | POA: Diagnosis not present

## 2018-02-04 DIAGNOSIS — R000 Tachycardia, unspecified: Secondary | ICD-10-CM | POA: Diagnosis not present

## 2018-02-04 DIAGNOSIS — E559 Vitamin D deficiency, unspecified: Secondary | ICD-10-CM | POA: Diagnosis not present

## 2018-02-04 DIAGNOSIS — E785 Hyperlipidemia, unspecified: Secondary | ICD-10-CM

## 2018-02-04 DIAGNOSIS — E538 Deficiency of other specified B group vitamins: Secondary | ICD-10-CM

## 2018-02-04 DIAGNOSIS — G4700 Insomnia, unspecified: Secondary | ICD-10-CM

## 2018-02-04 DIAGNOSIS — F411 Generalized anxiety disorder: Secondary | ICD-10-CM

## 2018-02-04 NOTE — Progress Notes (Signed)
Subjective:  I acted as [facility] Neurosurgeonscribe for Dr. Abner GreenspanBlyth. Wendy Harrell, [place]  Patient ID: Wendy PapKathleen Harrell, female    DOB: 09-Jul-1975, 42 y.o.   MRN: [id number]  Chief Complaint  Patient presents with  . Annual Exam    HPI  Patient is in today for an annual exam and follow up. She is tearful regarding her daughter's health concerns. She is currently being evaluated for autism and she also struggles with noise sensitivities and constipation. The patient herself struggles with fatigue, insomnia and anxiety resulting in tachycardia at times. No recent febrile illness or hospitalizations. Manages her activities of daily living well. Tries to maintain Harrell heart healthy diet. Denies CP/palp/SOB/HA/congestion/fevers/GI or GU c/o. Taking meds as prescribed  Patient Care Team: Wendy Harrell, Wendy Bretado A, MD as PCP - General (Family Medicine)   Past Medical History:  Diagnosis Date  . Cervical cancer screening 08/29/2013  . Chicken pox as Harrell child  . Dermatitis 03/01/2011  . GERD (gastroesophageal reflux disease)   . GERD (gastroesophageal reflux disease) 01/18/2016  . History of echocardiogram    Harrell. Echo 6/17: EF 55-60%, no RWMA, normal diastolic function, trivial MR, mild TR  . 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 Hypoglycemia 06/09/2016  . Insomnia 06/09/2016  . Interstitial cystitis 03/01/2011  . Loss of weight 08/23/2014  . Raynaud's disease   . Sinus tachycardia    Since her 20's.  Resting HR of 100-110 is not unusual  //  Event monitor 6/17: NSR, no sig arrhythmias   . Slipped cervical disc   . Vitamin B 12 deficiency 10/21/2015  . Vitamin D deficiency 10/07/2015    Past Surgical History:  Procedure Laterality Date  . cystoscopy  2001   benign  . laproscopy  2001  . LUMBAR LAMINECTOMY/DECOMPRESSION MICRODISCECTOMY Left 08/26/2015   Procedure: MICRO LUMBAR DECOMPRESSION  L5 - S1 ON LEFT;  Surgeon: Jene EveryJeffrey Beane, MD;  Location: [facility];  Service: Orthopedics;  Laterality: Left;    Family History  Problem Relation Age  of Onset  . Depression Father   . Heart disease Father   . Hypertension Father   . Hyperlipidemia Father   . Diabetes Father        type 2  . Arthritis Father   . Anxiety disorder Father   . Colon polyps Father   . Alcohol abuse Maternal Grandfather   . Depression Paternal Grandmother   . Alcohol abuse Paternal Grandmother   . Alcohol abuse Paternal Grandfather   . Heart attack Paternal Grandfather   . Cancer Maternal Grandmother        skin cancer, breast cancer  . Gallbladder disease Maternal Grandmother   . Alzheimer's disease Maternal Aunt   . Hyperlipidemia Mother   . Hyperparathyroidism Mother   . Gallbladder disease Maternal Aunt        x4  . Gallbladder disease Maternal Uncle   . Colon cancer Neg Hx   . Esophageal cancer Neg Hx     Social History   Socioeconomic History  . Marital status: Married    Spouse name: Not on file  . Number of children: 1  . Years of education: Not on file  . Highest education level: Not on file  Occupational History  . 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 Financial resource strain: Not on file  . Food insecurity:    Worry: Not on file    Inability: Not on file  . Transportation needs:    Medical: Not on file    Non-medical:  Not on file  Tobacco Use  . Smoking status: Never Smoker  . Smokeless tobacco: Never Used  Substance and Sexual Activity  . Alcohol use: No  . Drug use: No  . Sexual activity: Yes    Partners: Male    Birth control/protection: Surgical  Lifestyle  . Physical activity:    Days per week: Not on file    Minutes per session: Not on file  . Stress: Not on file  Relationships  . Social connections:    Talks on phone: Not on file    Gets together: Not on file    Attends religious service: Not on file    Active member of club or organization: Not on file    Attends meetings of clubs or organizations: Not on file    Relationship status: Not on file  . Intimate partner violence:    Fear of current  or ex partner: Not on file    Emotionally abused: Not on file    Physically abused: Not on file    Forced sexual activity: Not on file  Other Topics Concern  . Not on file  Social History Narrative  . Not on file    Outpatient Medications Prior to Visit  Medication Sig Dispense Refill  . carvedilol (COREG) 3.125 MG tablet Take 1 tablet (3.125 mg total) by mouth 2 (two) times daily with Harrell meal. 60 tablet 3  . LORazepam (ATIVAN) 1 MG tablet Take 1-2 tablets (1-2 mg total) by mouth every 8 (eight) hours as needed for anxiety or sleep. 40 tablet 1  . buPROPion (WELLBUTRIN) 75 MG tablet Take 75 mg by mouth 2 (two) times daily.  0  . hydrOXYzine (ATARAX/VISTARIL) 10 MG tablet 1-2 tab po tid as needed anxiety. 30 tablet 1   No facility-administered medications prior to visit.     Allergies  Allergen Reactions  . Prozac [Fluoxetine Hcl]   . Robaxin [Methocarbamol] Other (See Comments)    syncope  . Adhesive [Tape] Rash    Band-Aids gives pt Harrell rash  . Sulfa Antibiotics Rash    Blisters in the mouth    Review of Systems  Constitutional: Positive for malaise/fatigue. Negative for chills and fever.  HENT: Negative for congestion and hearing loss.   Eyes: Negative for discharge.  Respiratory: Negative for cough, sputum production and shortness of breath.   Cardiovascular: Negative for chest pain, palpitations and leg swelling.  Gastrointestinal: Negative for abdominal pain, blood in stool, constipation, diarrhea, heartburn, nausea and vomiting.  Genitourinary: Negative for dysuria, frequency, hematuria and urgency.  Musculoskeletal: Negative for back pain, falls and myalgias.  Skin: Negative for rash.  Neurological: Negative for dizziness, sensory change, loss of consciousness, weakness and headaches.  Endo/Heme/Allergies: Negative for environmental allergies. Does not bruise/bleed easily.  Psychiatric/Behavioral: Positive for depression. Negative for suicidal ideas. The patient is  nervous/anxious and has insomnia.        Objective:    Physical Exam  Constitutional: She is oriented to person, place, and time. No distress.  HENT:  Head: Normocephalic and atraumatic.  Right Ear: External ear normal.  Left Ear: External ear normal.  Nose: Nose normal.  Mouth/Throat: Oropharynx is clear and moist. No oropharyngeal exudate.  Eyes: Pupils are equal, round, and reactive to light. Conjunctivae are normal. Right eye exhibits no discharge. Left eye exhibits no discharge. No scleral icterus.  Neck: Normal range of motion. Neck supple. No thyromegaly present.  Cardiovascular: Normal rate, regular rhythm, normal heart sounds and intact distal  pulses.  No murmur heard. Pulmonary/Chest: Effort normal and breath sounds normal. No respiratory distress. She has no wheezes. She has no rales.  Abdominal: Soft. Bowel sounds are normal. She exhibits no distension and no mass. There is no tenderness.  Genitourinary: Uterus normal. Vaginal discharge found.  Musculoskeletal: Normal range of motion. She exhibits no edema or tenderness.  Lymphadenopathy:    She has no cervical adenopathy.  Neurological: She is alert and oriented to person, place, and time. She has normal reflexes. She displays normal reflexes. No cranial nerve deficit. Coordination normal.  Skin: Skin is warm and dry. No rash noted. She is not diaphoretic.    BP 98/72 (BP Location: Left Arm, Patient Position: Sitting, Cuff Size: Normal)   Pulse 90   Temp 98.3 F (36.8 C) (Oral)   Resp 18   Ht 5\' 1"  (1.549 m)   Wt 121 lb 9.6 oz (55.2 kg)   LMP 01/22/2018   SpO2 98%   BMI 22.98 kg/m  Wt Readings from Last 3 Encounters:  02/04/18 121 lb 9.6 oz (55.2 kg)  01/22/18 123 lb 3.2 oz (55.9 kg)  10/23/17 126 lb 6.4 oz (57.3 kg)   BP Readings from Last 3 Encounters:  02/04/18 98/72  01/22/18 110/77  10/23/17 102/72     Immunization History  Administered Date(s) Administered  . Influenza-Unspecified 03/12/2014      Health Maintenance  Topic Date Due  . TETANUS/TDAP  03/06/1995  . INFLUENZA VACCINE  01/10/2018  . Harrell SMEAR  01/03/2019  . HIV Screening  Completed    Lab Results  Component Value Date   WBC 6.6 03/12/2017   HGB 13.0 03/12/2017   HCT 38.9 03/12/2017   PLT 280.0 03/12/2017   GLUCOSE 92 01/22/2018   CHOL 179 06/02/2016   TRIG 46.0 06/02/2016   HDL 63.30 06/02/2016   LDLCALC 107 (H) 06/02/2016   ALT 14 01/22/2018   AST 13 01/22/2018   NA 139 01/22/2018   K 4.0 01/22/2018   CL 102 01/22/2018   CREATININE 0.69 01/22/2018   BUN 6 01/22/2018   CO2 32 01/22/2018   TSH 3.01 03/12/2017   INR 1.05 08/20/2015    Lab Results  Component Value Date   TSH 3.01 03/12/2017   Lab Results  Component Value Date   WBC 6.6 03/12/2017   HGB 13.0 03/12/2017   HCT 38.9 03/12/2017   MCV 96.2 03/12/2017   PLT 280.0 03/12/2017   Lab Results  Component Value Date   NA 139 01/22/2018   K 4.0 01/22/2018   CO2 32 01/22/2018   GLUCOSE 92 01/22/2018   BUN 6 01/22/2018   CREATININE 0.69 01/22/2018   BILITOT 0.6 01/22/2018   ALKPHOS 71 01/22/2018   AST 13 01/22/2018   ALT 14 01/22/2018   PROT 7.8 01/22/2018   ALBUMIN 4.8 01/22/2018   CALCIUM 9.9 01/22/2018   ANIONGAP 8 10/29/2015   GFR 99.22 01/22/2018   Lab Results  Component Value Date   CHOL 179 06/02/2016   Lab Results  Component Value Date   HDL 63.30 06/02/2016   Lab Results  Component Value Date   LDLCALC 107 (H) 06/02/2016   Lab Results  Component Value Date   TRIG 46.0 06/02/2016   Lab Results  Component Value Date   CHOLHDL 3 06/02/2016   No results found for: HGBA1C       Assessment & Plan:   Problem List Items Addressed This Visit    Preventative health care - Primary (Chronic)  Patient encouraged to maintain heart healthy diet, regular exercise, adequate sleep. Consider daily probiotics. Take medications as prescribed. Labs ordered and reviewed      Cervical cancer screening    Harrell today, no  concerns on exam. Scant vaginal discharge will check yeast and bv       Relevant Orders   Cytology - Harrell   Sinus tachycardia    Improved on recheck      Relevant Orders   CBC   Comprehensive metabolic panel   TSH   Vitamin D deficiency    Monitor and supplement      Relevant Orders   Vitamin D (25 hydroxy)   Generalized anxiety disorder    Cries easily and has been struggling with her daughter being home schooled and being evaluated for Autism and trouble with GI issues.       Vitamin B 12 deficiency    Monitor and supplement      GERD (gastroesophageal reflux disease)   Insomnia    Encouraged good sleep hygiene such as dark, quiet room. No blue/green glowing lights such as computer screens in bedroom. No alcohol or stimulants in evening. Cut down on caffeine as able. Regular exercise is helpful but not just prior to bed time. Can use Melatonin 5 to 10 mg qhs and/or Lorazepam 1-2 mg qhs prn.       Hyperlipidemia    Encouraged heart healthy diet, increase exercise, avoid trans fats, consider Harrell krill oil cap daily      Relevant Orders   Lipid panel      I have discontinued Starletta Heiney's buPROPion and hydrOXYzine. I am also having her maintain her carvedilol and LORazepam.  No orders of the defined types were placed in this encounter.   CMA served as Neurosurgeon during this visit. History, Physical and Plan performed by medical provider. Documentation and orders reviewed and attested to.  Danise Edge, MD+

## 2018-02-04 NOTE — Assessment & Plan Note (Signed)
Monitor and supplement 

## 2018-02-04 NOTE — Assessment & Plan Note (Signed)
Encouraged heart healthy diet, increase exercise, avoid trans fats, consider a krill oil cap daily 

## 2018-02-04 NOTE — Assessment & Plan Note (Addendum)
Pap today, no concerns on exam. Scant vaginal discharge will check yeast and bv

## 2018-02-04 NOTE — Assessment & Plan Note (Signed)
Cries easily and has been struggling with her daughter being home schooled and being evaluated for Autism and trouble with GI issues.

## 2018-02-04 NOTE — Assessment & Plan Note (Signed)
Patient encouraged to maintain heart healthy diet, regular exercise, adequate sleep. Consider daily probiotics. Take medications as prescribed. Labs ordered and reviewed 

## 2018-02-04 NOTE — Assessment & Plan Note (Signed)
Encouraged good sleep hygiene such as dark, quiet room. No blue/green glowing lights such as computer screens in bedroom. No alcohol or stimulants in evening. Cut down on caffeine as able. Regular exercise is helpful but not just prior to bed time. Can use Melatonin 5 to 10 mg qhs and/or Lorazepam 1-2 mg qhs prn.

## 2018-02-04 NOTE — Patient Instructions (Addendum)
Encouraged increased hydration and fiber in diet. Daily probiotics. If bowels not moving can use MOM 2 tbls po in 4 oz of warm prune juice by mouth every 2-3 days. If no results then repeat in 4 hours with  Dulcolax or Glycerin suppositorysuppository pr, may repeat again in 4 more hours as needed. Seek care if symptoms worsen. Consider daily Miralax and/or Dulcolax if symptoms persist.   Melatonin 5-10 mg at bedtime as needed   Preventive Care 18-39 Years, Female Preventive care refers to lifestyle choices and visits with your health care provider that can promote health and wellness. What does preventive care include?  A yearly physical exam. This is also called an annual well check.  Dental exams once or twice a year.  Routine eye exams. Ask your health care provider how often you should have your eyes checked.  Personal lifestyle choices, including: ? Daily care of your teeth and gums. ? Regular physical activity. ? Eating a healthy diet. ? Avoiding tobacco and drug use. ? Limiting alcohol use. ? Practicing safe sex. ? Taking vitamin and mineral supplements as recommended by your health care provider. What happens during an annual well check? The services and screenings done by your health care provider during your annual well check will depend on your age, overall health, lifestyle risk factors, and family history of disease. Counseling Your health care provider may ask you questions about your:  Alcohol use.  Tobacco use.  Drug use.  Emotional well-being.  Home and relationship well-being.  Sexual activity.  Eating habits.  Work and work Statistician.  Method of birth control.  Menstrual cycle.  Pregnancy history.  Screening You may have the following tests or measurements:  Height, weight, and BMI.  Diabetes screening. This is done by checking your blood sugar (glucose) after you have not eaten for a while (fasting).  Blood pressure.  Lipid and  cholesterol levels. These may be checked every 5 years starting at age 10.  Skin check.  Hepatitis C blood test.  Hepatitis B blood test.  Sexually transmitted disease (STD) testing.  BRCA-related cancer screening. This may be done if you have a family history of breast, ovarian, tubal, or peritoneal cancers.  Pelvic exam and Pap test. This may be done every 3 years starting at age 65. Starting at age 39, this may be done every 5 years if you have a Pap test in combination with an HPV test.  Discuss your test results, treatment options, and if necessary, the need for more tests with your health care provider. Vaccines Your health care provider may recommend certain vaccines, such as:  Influenza vaccine. This is recommended every year.  Tetanus, diphtheria, and acellular pertussis (Tdap, Td) vaccine. You may need a Td booster every 10 years.  Varicella vaccine. You may need this if you have not been vaccinated.  HPV vaccine. If you are 53 or younger, you may need three doses over 6 months.  Measles, mumps, and rubella (MMR) vaccine. You may need at least one dose of MMR. You may also need a second dose.  Pneumococcal 13-valent conjugate (PCV13) vaccine. You may need this if you have certain conditions and were not previously vaccinated.  Pneumococcal polysaccharide (PPSV23) vaccine. You may need one or two doses if you smoke cigarettes or if you have certain conditions.  Meningococcal vaccine. One dose is recommended if you are age 72-21 years and a first-year college student living in a residence hall, or if you have one of several  medical conditions. You may also need additional booster doses.  Hepatitis A vaccine. You may need this if you have certain conditions or if you travel or work in places where you may be exposed to hepatitis A.  Hepatitis B vaccine. You may need this if you have certain conditions or if you travel or work in places where you may be exposed to hepatitis  B.  Haemophilus influenzae type b (Hib) vaccine. You may need this if you have certain risk factors.  Talk to your health care provider about which screenings and vaccines you need and how often you need them. This information is not intended to replace advice given to you by your health care provider. Make sure you discuss any questions you have with your health care provider. Document Released: 07/25/2001 Document Revised: 02/16/2016 Document Reviewed: 03/30/2015 Elsevier Interactive Patient Education  [employer].

## 2018-02-04 NOTE — Assessment & Plan Note (Signed)
Improved on recheck.  

## 2018-02-05 ENCOUNTER — Encounter: Payer: Self-pay | Admitting: Family Medicine

## 2018-02-05 LAB — COMPREHENSIVE METABOLIC PANEL
ALT: 13 U/L (ref 0–35)
AST: 12 U/L (ref 0–37)
Albumin: 4.6 g/dL (ref 3.5–5.2)
Alkaline Phosphatase: 68 U/L (ref 39–117)
BILIRUBIN TOTAL: 0.6 mg/dL (ref 0.2–1.2)
BUN: 7 mg/dL (ref 6–23)
CO2: 29 mEq/L (ref 19–32)
Calcium: 10.4 mg/dL (ref 8.4–10.5)
Chloride: 102 mEq/L (ref 96–112)
Creatinine, Ser: 0.66 mg/dL (ref 0.40–1.20)
GFR: 104.43 mL/min (ref >60.00)
GLUCOSE: 92 mg/dL (ref 70–99)
Potassium: 4.5 mEq/L (ref 3.5–5.1)
Sodium: 139 mEq/L (ref 135–145)
TOTAL PROTEIN: 7.5 g/dL (ref 6.0–8.3)

## 2018-02-05 LAB — CBC
HCT: 41.4 % (ref 36.0–46.0)
HEMOGLOBIN: 14.2 g/dL (ref 12.0–15.0)
MCHC: 34.2 g/dL (ref 30.0–36.0)
MCV: 95.4 fl (ref 78.0–100.0)
PLATELETS: 249 10*3/uL (ref 150.0–400.0)
RBC: 4.34 Mil/uL (ref 3.87–5.11)
RDW: 12.7 % (ref 11.5–15.5)
WBC: 6.1 10*3/uL (ref 4.0–10.5)

## 2018-02-05 LAB — LIPID PANEL
Cholesterol: 194 mg/dL (ref 0–200)
HDL: 63.1 mg/dL (ref >39.00)
LDL Cholesterol: 117 mg/dL — ABNORMAL HIGH (ref 0–99)
NONHDL: 130.89
Total CHOL/HDL Ratio: 3
Triglycerides: 67 mg/dL (ref 0.0–149.0)
VLDL: 13.4 mg/dL (ref 0.0–40.0)

## 2018-02-05 LAB — VITAMIN D 25 HYDROXY (VIT D DEFICIENCY, FRACTURES): VITD: 32.29 ng/mL (ref 30.00–100.00)

## 2018-02-05 LAB — TSH: TSH: 1.55 u[IU]/mL (ref 0.35–4.50)

## 2018-02-06 LAB — CYTOLOGY - PAP
Bacterial vaginitis: NEGATIVE
CANDIDA VAGINITIS: NEGATIVE
DIAGNOSIS: NEGATIVE

## 2018-02-07 ENCOUNTER — Encounter: Payer: Self-pay | Admitting: Family Medicine

## 2018-02-13 ENCOUNTER — Other Ambulatory Visit: Payer: Self-pay | Admitting: Family Medicine

## 2018-02-15 ENCOUNTER — Encounter: Payer: Self-pay | Admitting: Family Medicine

## 2018-02-15 ENCOUNTER — Telehealth: Payer: Self-pay | Admitting: Family Medicine

## 2018-02-15 NOTE — Telephone Encounter (Signed)
Copied from CRM [id number]. Topic: Inquiry >> Feb 15, 2018  2:36 PM Maia Petties wrote: Reason for CRM: pt states she was started on carvedilol (COREG) 3.125 MG tablet and cannot take the nausea anymore. She is asking how to wean off and start something else. Please call to advise.

## 2018-02-15 NOTE — Telephone Encounter (Signed)
Please advise 

## 2018-02-16 NOTE — Telephone Encounter (Signed)
I think the only thing she tolerated was Bystolic 5 mg she just needs to switch back. She can check GoodRx and CoverMyMeds to see if she can find a coupn or a good cash pay price then we can send it in to what ever pharmacy she would like. No titrating necessary if she is is willing to switch

## 2018-02-18 ENCOUNTER — Other Ambulatory Visit: Payer: Self-pay | Admitting: Family Medicine

## 2018-02-18 MED ORDER — METOPROLOL TARTRATE 25 MG PO TABS: 12.5000 mg | ORAL_TABLET | Freq: Two times a day (BID) | ORAL | 3 refills | Status: DC

## 2018-02-25 NOTE — Telephone Encounter (Signed)
Medication sent in patient made aware 

## 2018-02-26 ENCOUNTER — Other Ambulatory Visit: Payer: Self-pay | Admitting: Family Medicine

## 2018-02-26 ENCOUNTER — Encounter: Payer: Self-pay | Admitting: Family Medicine

## 2018-02-26 MED ORDER — VALACYCLOVIR HCL 1 G PO TABS: 1000.0000 mg | ORAL_TABLET | Freq: Two times a day (BID) | ORAL | 2 refills | Status: AC | PRN

## 2018-03-07 ENCOUNTER — Other Ambulatory Visit: Payer: Self-pay | Admitting: Family Medicine

## 2018-03-07 ENCOUNTER — Encounter: Payer: Self-pay | Admitting: Family Medicine

## 2018-03-09 ENCOUNTER — Other Ambulatory Visit: Payer: Self-pay | Admitting: Family Medicine

## 2018-03-10 ENCOUNTER — Encounter: Payer: Self-pay | Admitting: Family Medicine

## 2018-03-11 MED ORDER — GABAPENTIN 100 MG PO CAPS: ORAL_CAPSULE | ORAL | 1 refills | Status: AC

## 2018-03-11 MED ORDER — LORAZEPAM 1 MG PO TABS: 1.0000 mg | ORAL_TABLET | Freq: Three times a day (TID) | ORAL | 1 refills | Status: AC | PRN

## 2018-03-11 NOTE — Telephone Encounter (Signed)
Requesting:ativan  Contract:no UDS:no Last OV:02/04/18 Next OV:03/12/18 Last Refill:01/24/18 #[id number] Database:   Delete mychart request  Please advise

## 2018-03-12 ENCOUNTER — Encounter: Payer: Self-pay | Admitting: Family Medicine

## 2018-03-12 ENCOUNTER — Ambulatory Visit (INDEPENDENT_AMBULATORY_CARE_PROVIDER_SITE_OTHER): Payer: BLUE CROSS/BLUE SHIELD | Admitting: Family Medicine

## 2018-03-12 DIAGNOSIS — G4700 Insomnia, unspecified: Secondary | ICD-10-CM | POA: Diagnosis not present

## 2018-03-12 DIAGNOSIS — R000 Tachycardia, unspecified: Secondary | ICD-10-CM

## 2018-03-12 MED ORDER — AMITRIPTYLINE HCL 10 MG PO TABS: 10.0000 mg | ORAL_TABLET | Freq: Every day | ORAL | 2 refills | Status: AC

## 2018-03-12 MED ORDER — AMLODIPINE BESYLATE 2.5 MG PO TABS: 1.2500 mg | ORAL_TABLET | Freq: Every day | ORAL | 0 refills | Status: AC

## 2018-03-12 NOTE — Patient Instructions (Addendum)
Encouraged increased hydration and fiber in diet. Daily probiotics. If bowels not moving can use MOM 2 tbls po in 4 oz of warm prune juice by mouth every 2-3 days. If no results then repeat in 4 hours with  Dulcolax suppository pr, may repeat again in 4 more hours as needed. Seek care if symptoms worsen. Consider daily Miralax and/or Dulcolax if symptoms persist.    Mix the Miralax with Benefiber together once or twice daily  Vitamin B12 500 mg SL once daily Insomnia Insomnia is a sleep disorder that makes it difficult to fall asleep or to stay asleep. Insomnia can cause tiredness (fatigue), low energy, difficulty concentrating, mood swings, and poor performance at work or school. There are three different ways to classify insomnia:  Difficulty falling asleep.  Difficulty staying asleep.  Waking up too early in the morning.  Any type of insomnia can be long-term (chronic) or short-term (acute). Both are common. Short-term insomnia usually lasts for three months or less. Chronic insomnia occurs at least three times a week for longer than three months. What are the causes? Insomnia may be caused by another condition, situation, or substance, such as:  Anxiety.  Certain medicines.  Gastroesophageal reflux disease (GERD) or other gastrointestinal conditions.  Asthma or other breathing conditions.  Restless legs syndrome, sleep apnea, or other sleep disorders.  Chronic pain.  Menopause. This may include hot flashes.  Stroke.  Abuse of alcohol, tobacco, or illegal drugs.  Depression.  Caffeine.  Neurological disorders, such as Alzheimer disease.  An overactive thyroid (hyperthyroidism).  The cause of insomnia may not be known. What increases the risk? Risk factors for insomnia include:  Gender. Women are more commonly affected than men.  Age. Insomnia is more common as you get older.  Stress. This may involve your professional or personal life.  Income. Insomnia is  more common in people with lower income.  Lack of exercise.  Irregular work schedule or night shifts.  Traveling between different time zones.  What are the signs or symptoms? If you have insomnia, trouble falling asleep or trouble staying asleep is the main symptom. This may lead to other symptoms, such as:  Feeling fatigued.  Feeling nervous about going to sleep.  Not feeling rested in the morning.  Having trouble concentrating.  Feeling irritable, anxious, or depressed.  How is this treated? Treatment for insomnia depends on the cause. If your insomnia is caused by an underlying condition, treatment will focus on addressing the condition. Treatment may also include:  Medicines to help you sleep.  Counseling or therapy.  Lifestyle adjustments.  Follow these instructions at home:  Take medicines only as directed by your health care provider.  Keep regular sleeping and waking hours. Avoid naps.  Keep a sleep diary to help you and your health care provider figure out what could be causing your insomnia. Include: ? When you sleep. ? When you wake up during the night. ? How well you sleep. ? How rested you feel the next day. ? Any side effects of medicines you are taking. ? What you eat and drink.  Make your bedroom a comfortable place where it is easy to fall asleep: ? Put up shades or special blackout curtains to block light from outside. ? Use a white noise machine to block noise. ? Keep the temperature cool.  Exercise regularly as directed by your health care provider. Avoid exercising right before bedtime.  Use relaxation techniques to manage stress. Ask your health care provider  to suggest some techniques that may work well for you. These may include: ? Breathing exercises. ? Routines to release muscle tension. ? Visualizing peaceful scenes.  Cut back on alcohol, caffeinated beverages, and cigarettes, especially close to bedtime. These can disrupt your  sleep.  Do not overeat or eat spicy foods right before bedtime. This can lead to digestive discomfort that can make it hard for you to sleep.  Limit screen use before bedtime. This includes: ? Watching TV. ? Using your smartphone, tablet, and computer.  Stick to a routine. This can help you fall asleep faster. Try to do a quiet activity, brush your teeth, and go to bed at the same time each night.  Get out of bed if you are still awake after 15 minutes of trying to sleep. Keep the lights down, but try reading or doing a quiet activity. When you feel sleepy, go back to bed.  Make sure that you drive carefully. Avoid driving if you feel very sleepy.  Keep all follow-up appointments as directed by your health care provider. This is important. Contact a health care provider if:  You are tired throughout the day or have trouble in your daily routine due to sleepiness.  You continue to have sleep problems or your sleep problems get worse. Get help right away if:  You have serious thoughts about hurting yourself or someone else. This information is not intended to replace advice given to you by your health care provider. Make sure you discuss any questions you have with your health care provider. Document Released: 05/26/2000 Document Revised: 10/29/2015 Document Reviewed: 02/27/2014 Elsevier Interactive Patient Education  [employer].

## 2018-03-17 NOTE — Assessment & Plan Note (Signed)
Stop metoprolol and start Amlodipine 2.5 mg dialy

## 2018-03-17 NOTE — Assessment & Plan Note (Signed)
Patient tearful,, shaky and not sleeping. Counseled for 25 minutes and decision made to start Amitriptyline 10 mg qhs. Encouraged good sleep hygiene such as dark, quiet room. No blue/green glowing lights such as computer screens in bedroom. No alcohol or stimulants in evening. Cut down on caffeine as able. Regular exercise is helpful but not just prior to bed time.

## 2018-03-17 NOTE — Progress Notes (Signed)
Subjective:    Patient ID: Wendy Harrell, female    DOB: [birth year], 42 y.o.   MRN: [id number]  No chief complaint on file.   HPI Patient is in today for evaluaiton of worsening insomnia. She is very tearful and tremulous and stressed as she manages her daughter's struggles and more. She is struggling with anhedonia but does not endorse suicidal ideation. Denies CP/palp/SOB/HA/congestion/fevers/GI or GU c/o. Taking meds as prescribed  Past Medical History:  Diagnosis Date  . Cervical cancer screening 08/29/2013  . Chicken pox as a child  . Dermatitis 03/01/2011  . GERD (gastroesophageal reflux disease)   . GERD (gastroesophageal reflux disease) 01/18/2016  . History of echocardiogram    a. Echo 6/17: EF 55-60%, no RWMA, normal diastolic function, trivial MR, mild TR  . 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 Hypoglycemia 06/09/2016  . Insomnia 06/09/2016  . Interstitial cystitis 03/01/2011  . Loss of weight 08/23/2014  . Raynaud's disease   . Sinus tachycardia    Since her 20's.  Resting HR of 100-110 is not unusual  //  Event monitor 6/17: NSR, no sig arrhythmias   . Slipped cervical disc   . Vitamin B 12 deficiency 10/21/2015  . Vitamin D deficiency 10/07/2015    Past Surgical History:  Procedure Laterality Date  . cystoscopy  2001   benign  . laproscopy  2001  . LUMBAR LAMINECTOMY/DECOMPRESSION MICRODISCECTOMY Left 08/26/2015   Procedure: MICRO LUMBAR DECOMPRESSION  L5 - S1 ON LEFT;  Surgeon: Jene Every, MD;  Location: [facility];  Service: Orthopedics;  Laterality: Left;    Family History  Problem Relation Age of Onset  . Depression Father   . Heart disease Father   . Hypertension Father   . Hyperlipidemia Father   . Diabetes Father        type 2  . Arthritis Father   . Anxiety disorder Father   . Colon polyps Father   . Alcohol abuse Maternal Grandfather   . Depression Paternal Grandmother   . Alcohol abuse Paternal Grandmother   . Alcohol abuse Paternal Grandfather   .  Heart attack Paternal Grandfather   . Cancer Maternal Grandmother        skin cancer, breast cancer  . Gallbladder disease Maternal Grandmother   . Alzheimer's disease Maternal Aunt   . Hyperlipidemia Mother   . Hyperparathyroidism Mother   . Gallbladder disease Maternal Aunt        x4  . Gallbladder disease Maternal Uncle   . Colon cancer Neg Hx   . Esophageal cancer Neg Hx     Social History   Socioeconomic History  . Marital status: Married    Spouse name: Not on file  . Number of children: 1  . Years of education: Not on file  . Highest education level: Not on file  Occupational History  . 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 Financial resource strain: Not on file  . Food insecurity:    Worry: Not on file    Inability: Not on file  . Transportation needs:    Medical: Not on file    Non-medical: Not on file  Tobacco Use  . Smoking status: Never Smoker  . Smokeless tobacco: Never Used  Substance and Sexual Activity  . Alcohol use: No  . Drug use: No  . Sexual activity: Yes    Partners: Male    Birth control/protection: Surgical  Lifestyle  . Physical activity:    Days per week: Not on file  Minutes per session: Not on file  . Stress: Not on file  Relationships  . Social connections:    Talks on phone: Not on file    Gets together: Not on file    Attends religious service: Not on file    Active member of club or organization: Not on file    Attends meetings of clubs or organizations: Not on file    Relationship status: Not on file  . Intimate partner violence:    Fear of current or ex partner: Not on file    Emotionally abused: Not on file    Physically abused: Not on file    Forced sexual activity: Not on file  Other Topics Concern  . Not on file  Social History Narrative  . Not on file    Outpatient Medications Prior to Visit  Medication Sig Dispense Refill  . gabapentin (NEURONTIN) 100 MG capsule Take 100-300 mg qhs prn 90 capsule 1  .  LORazepam (ATIVAN) 1 MG tablet Take 1-2 tablets (1-2 mg total) by mouth every 8 (eight) hours as needed for anxiety or sleep. 40 tablet 1  . valACYclovir (VALTREX) 1000 MG tablet Take 1 tablet (1,000 mg total) by mouth 2 (two) times daily as needed (outbreak). 20 tablet 2  . metoprolol tartrate (LOPRESSOR) 25 MG tablet Take 0.5 tablets (12.5 mg total) by mouth 2 (two) times daily. 30 tablet 3  . LORazepam (ATIVAN) 1 MG tablet TAKE 1-2 TABLETS BY MOUTH EVERY 8 (EIGHT) HOURS AS NEEDED FOR ANXIETY OR SLEEP. 40 tablet 1   No facility-administered medications prior to visit.     Allergies  Allergen Reactions  . Prozac [Fluoxetine Hcl]   . Robaxin [Methocarbamol] Other (See Comments)    syncope  . Adhesive [Tape] Rash    Band-Aids gives pt a rash  . Sulfa Antibiotics Rash    Blisters in the mouth    Review of Systems  Constitutional: Negative for fever and malaise/fatigue.  HENT: Negative for congestion.   Eyes: Negative for blurred vision.  Respiratory: Negative for shortness of breath.   Cardiovascular: Negative for chest pain, palpitations and leg swelling.  Gastrointestinal: Negative for abdominal pain, blood in stool and nausea.  Genitourinary: Negative for dysuria and frequency.  Musculoskeletal: Negative for falls.  Skin: Negative for rash.  Neurological: Negative for dizziness, loss of consciousness and headaches.  Endo/Heme/Allergies: Negative for environmental allergies.  Psychiatric/Behavioral: Positive for depression. The patient is nervous/anxious and has insomnia.        Objective:    Physical Exam  Constitutional: She is oriented to person, place, and time. She appears well-developed and well-nourished. No distress.  HENT:  Head: Normocephalic and atraumatic.  Nose: Nose normal.  Eyes: Right eye exhibits no discharge. Left eye exhibits no discharge.  Neck: Normal range of motion. Neck supple.  Cardiovascular: Normal rate and regular rhythm.  No murmur  heard. Pulmonary/Chest: Effort normal and breath sounds normal.  Abdominal: Soft. Bowel sounds are normal. There is no tenderness.  Musculoskeletal: She exhibits no edema.  Neurological: She is alert and oriented to person, place, and time.  Skin: Skin is warm and dry.  Psychiatric: She has a normal mood and affect.  Nursing note and vitals reviewed.   BP 92/72 (BP Location: Left Arm, Patient Position: Sitting, Cuff Size: Normal)   Pulse 94   Temp 98.3 F (36.8 C) (Oral)   Resp 18   Wt 117 lb 3.2 oz (53.2 kg)   SpO2 (!) 9%  BMI 22.14 kg/m  Wt Readings from Last 3 Encounters:  03/12/18 117 lb 3.2 oz (53.2 kg)  02/04/18 121 lb 9.6 oz (55.2 kg)  01/22/18 123 lb 3.2 oz (55.9 kg)     Lab Results  Component Value Date   WBC 6.1 02/04/2018   HGB 14.2 02/04/2018   HCT 41.4 02/04/2018   PLT 249.0 02/04/2018   GLUCOSE 92 02/04/2018   CHOL 194 02/04/2018   TRIG 67.0 02/04/2018   HDL 63.10 02/04/2018   LDLCALC 117 (H) 02/04/2018   ALT 13 02/04/2018   AST 12 02/04/2018   NA 139 02/04/2018   K 4.5 02/04/2018   CL 102 02/04/2018   CREATININE 0.66 02/04/2018   BUN 7 02/04/2018   CO2 29 02/04/2018   TSH 1.55 02/04/2018   INR 1.05 08/20/2015    Lab Results  Component Value Date   TSH 1.55 02/04/2018   Lab Results  Component Value Date   WBC 6.1 02/04/2018   HGB 14.2 02/04/2018   HCT 41.4 02/04/2018   MCV 95.4 02/04/2018   PLT 249.0 02/04/2018   Lab Results  Component Value Date   NA 139 02/04/2018   K 4.5 02/04/2018   CO2 29 02/04/2018   GLUCOSE 92 02/04/2018   BUN 7 02/04/2018   CREATININE 0.66 02/04/2018   BILITOT 0.6 02/04/2018   ALKPHOS 68 02/04/2018   AST 12 02/04/2018   ALT 13 02/04/2018   PROT 7.5 02/04/2018   ALBUMIN 4.6 02/04/2018   CALCIUM 10.4 02/04/2018   ANIONGAP 8 10/29/2015   GFR 104.43 02/04/2018   Lab Results  Component Value Date   CHOL 194 02/04/2018   Lab Results  Component Value Date   HDL 63.10 02/04/2018   Lab Results   Component Value Date   LDLCALC 117 (H) 02/04/2018   Lab Results  Component Value Date   TRIG 67.0 02/04/2018   Lab Results  Component Value Date   CHOLHDL 3 02/04/2018   No results found for: HGBA1C     Assessment & Plan:   Problem List Items Addressed This Visit    Sinus tachycardia    Stop metoprolol and start Amlodipine 2.5 mg dialy      Insomnia    Patient tearful,, shaky and not sleeping. Counseled for 25 minutes and decision made to start Amitriptyline 10 mg qhs. Encouraged good sleep hygiene such as dark, quiet room. No blue/green glowing lights such as computer screens in bedroom. No alcohol or stimulants in evening. Cut down on caffeine as able. Regular exercise is helpful but not just prior to bed time.          I have discontinued Arelie Nairn's metoprolol tartrate. I am also having her start on amLODipine and amitriptyline. Additionally, I am having her maintain her valACYclovir, gabapentin, and LORazepam.  Meds ordered this encounter  Medications  . amLODipine (NORVASC) 2.5 MG tablet    Sig: Take 0.5-1 tablets (1.25-2.5 mg total) by mouth daily.    Dispense:  90 tablet    Refill:  0  . amitriptyline (ELAVIL) 10 MG tablet    Sig: Take 1-2 tablets (10-20 mg total) by mouth at bedtime.    Dispense:  60 tablet    Refill:  2     Danise Edge, MD

## 2018-03-19 ENCOUNTER — Encounter: Payer: Self-pay | Admitting: Family Medicine

## 2018-03-27 ENCOUNTER — Other Ambulatory Visit: Payer: Self-pay | Admitting: Family Medicine

## 2018-03-27 MED ORDER — TRAZODONE HCL 50 MG PO TABS: 50.0000 mg | ORAL_TABLET | Freq: Every evening | ORAL | 1 refills | Status: AC | PRN

## 2018-04-10 ENCOUNTER — Telehealth: Payer: Self-pay | Admitting: Family Medicine

## 2018-04-10 NOTE — Telephone Encounter (Signed)
Copied from CRM [id number]. Topic: Quick Communication - See Telephone Encounter >> Apr 10, 2018  1:25 PM Valentina Lucks wrote: CRM for notification. See Telephone encounter for: 04/10/18.   Pt's mother dropped off document for provider to see and have on pt's file ( Large yellow envelope - States Personal and Confidential). Document put at front office tray under providers name.

## 2018-04-12 DEATH — deceased

## 2018-04-23 ENCOUNTER — Ambulatory Visit: Payer: BLUE CROSS/BLUE SHIELD | Admitting: Family Medicine

## 2018-05-06 ENCOUNTER — Ambulatory Visit: Payer: BLUE CROSS/BLUE SHIELD | Admitting: Family Medicine
